# Patient Record
Sex: Male | Born: 1970 | Race: Black or African American | Hispanic: No | Marital: Married | Smoking: Never smoker
Health system: Southern US, Community
[De-identification: ages and names within clinical notes are randomized; demographics above are authoritative.]

## PROBLEM LIST (undated history)

## (undated) DIAGNOSIS — E782 Mixed hyperlipidemia: Secondary | ICD-10-CM

## (undated) DIAGNOSIS — R002 Palpitations: Secondary | ICD-10-CM

## (undated) DIAGNOSIS — E7800 Pure hypercholesterolemia, unspecified: Secondary | ICD-10-CM

## (undated) DIAGNOSIS — R7989 Other specified abnormal findings of blood chemistry: Secondary | ICD-10-CM

## (undated) DIAGNOSIS — N200 Calculus of kidney: Secondary | ICD-10-CM

## (undated) DIAGNOSIS — R945 Abnormal results of liver function studies: Secondary | ICD-10-CM

## (undated) DIAGNOSIS — F419 Anxiety disorder, unspecified: Secondary | ICD-10-CM

## (undated) DIAGNOSIS — G4700 Insomnia, unspecified: Secondary | ICD-10-CM

## (undated) DIAGNOSIS — F3170 Bipolar disorder, currently in remission, most recent episode unspecified: Secondary | ICD-10-CM

## (undated) DIAGNOSIS — I10 Essential (primary) hypertension: Secondary | ICD-10-CM

## (undated) DIAGNOSIS — R42 Dizziness and giddiness: Secondary | ICD-10-CM

## (undated) DIAGNOSIS — F605 Obsessive-compulsive personality disorder: Secondary | ICD-10-CM

## (undated) DIAGNOSIS — F410 Panic disorder [episodic paroxysmal anxiety] without agoraphobia: Secondary | ICD-10-CM

## (undated) HISTORY — DX: Insomnia, unspecified: G47.00

## (undated) HISTORY — DX: Mixed hyperlipidemia: E78.2

## (undated) HISTORY — DX: Other specified abnormal findings of blood chemistry: R79.89

## (undated) HISTORY — DX: Calculus of kidney: N20.0

## (undated) HISTORY — PX: THYROID SURGERY: SHX805

## (undated) HISTORY — DX: Essential (primary) hypertension: I10

## (undated) HISTORY — DX: Dizziness and giddiness: R42

## (undated) HISTORY — DX: Pure hypercholesterolemia, unspecified: E78.00

## (undated) HISTORY — DX: Obsessive-compulsive personality disorder: F60.5

## (undated) HISTORY — DX: Bipolar disorder, currently in remission, most recent episode unspecified: F31.70

## (undated) HISTORY — DX: Abnormal results of liver function studies: R94.5

## (undated) HISTORY — DX: Palpitations: R00.2

---

## 2001-11-07 ENCOUNTER — Ambulatory Visit (HOSPITAL_COMMUNITY): Admission: RE | Admit: 2001-11-07 | Discharge: 2001-11-07 | Payer: Self-pay | Admitting: Family Medicine

## 2001-11-07 ENCOUNTER — Encounter: Payer: Self-pay | Admitting: Family Medicine

## 2001-12-03 ENCOUNTER — Encounter: Payer: Self-pay | Admitting: Family Medicine

## 2001-12-03 ENCOUNTER — Ambulatory Visit (HOSPITAL_COMMUNITY): Admission: RE | Admit: 2001-12-03 | Discharge: 2001-12-03 | Payer: Self-pay | Admitting: Family Medicine

## 2002-02-03 ENCOUNTER — Ambulatory Visit (HOSPITAL_COMMUNITY): Admission: RE | Admit: 2002-02-03 | Discharge: 2002-02-03 | Payer: Self-pay

## 2002-02-03 ENCOUNTER — Encounter (INDEPENDENT_AMBULATORY_CARE_PROVIDER_SITE_OTHER): Payer: Self-pay | Admitting: *Deleted

## 2003-05-13 ENCOUNTER — Emergency Department (HOSPITAL_COMMUNITY): Admission: EM | Admit: 2003-05-13 | Discharge: 2003-05-13 | Payer: Self-pay | Admitting: Emergency Medicine

## 2003-11-10 ENCOUNTER — Inpatient Hospital Stay (HOSPITAL_COMMUNITY): Admission: EM | Admit: 2003-11-10 | Discharge: 2003-11-13 | Payer: Self-pay | Admitting: Psychiatry

## 2008-10-18 ENCOUNTER — Emergency Department (HOSPITAL_COMMUNITY): Admission: EM | Admit: 2008-10-18 | Discharge: 2008-10-18 | Payer: Self-pay | Admitting: Emergency Medicine

## 2009-03-30 ENCOUNTER — Encounter: Admission: RE | Admit: 2009-03-30 | Discharge: 2009-03-30 | Payer: Self-pay | Admitting: Family Medicine

## 2010-01-31 ENCOUNTER — Emergency Department (HOSPITAL_COMMUNITY): Admission: EM | Admit: 2010-01-31 | Discharge: 2010-01-31 | Payer: Self-pay | Admitting: Emergency Medicine

## 2011-01-06 ENCOUNTER — Encounter: Payer: Self-pay | Admitting: Family Medicine

## 2011-01-07 ENCOUNTER — Encounter: Payer: Self-pay | Admitting: Family Medicine

## 2011-03-07 LAB — URINALYSIS, ROUTINE W REFLEX MICROSCOPIC
Bilirubin Urine: NEGATIVE
Glucose, UA: NEGATIVE mg/dL
Ketones, ur: NEGATIVE mg/dL
Protein, ur: NEGATIVE mg/dL

## 2011-03-07 LAB — POCT I-STAT, CHEM 8
BUN: 8 mg/dL (ref 6–23)
Calcium, Ion: 1.22 mmol/L (ref 1.12–1.32)
Chloride: 103 mEq/L (ref 96–112)
Sodium: 137 mEq/L (ref 135–145)

## 2011-05-04 NOTE — Discharge Summary (Signed)
NAME:  Eric Conway, Eric Conway NO.:  [id number]   MEDICAL RECORD NO.:  [id number]                   PATIENT TYPE:  IPS   LOCATION:  0505                                 FACILITY:  [facility]   PHYSICIAN:  Geoffery Lyons, M.D.                   DATE OF BIRTH:  1971/06/18   DATE OF ADMISSION:  11/10/2003  DATE OF DISCHARGE:  11/13/2003                                 DISCHARGE SUMMARY   CHIEF COMPLAINT AND PRESENT ILLNESS:  This was the first admission to [facility] for this 40 year old African-American male,  married.  He was referred by his psychiatrist for suicidal ideation.  He had  been looking on the Internet for ways to kill himself.  He recently spent  $10,000 on a trip to [facility] and regretted it.  He endorsed mood swings,  decreased sleep, two or three hours, hypersexual, increased irritability,  spending, depression, reclusiveness, unable to get motivated.   PAST PSYCHIATRIC HISTORY:  This was the first time at [facility].  He was put on lithium, took it for three weeks, then stopped; the  only medication he had tried.   SUBSTANCE ABUSE HISTORY:  Occasional use of alcohol.  Denied any other  substances.   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   MEDICATIONS:  None.   PHYSICAL EXAMINATION:  Physical examination was performed, failed to show  any acute findings.   MENTAL STATUS EXAM:  Mental status exam revealed a fully alert male, bright  affect, cooperative, articulate, calm, focused.  Speech: Normal, articulate,  no pressure.  Mood: Depressed with what was going on.  Affect: Broad.  Thought processes: Logical and coherent; suicidal ruminations, no active  plan, no homicidal ideas, no auditory and visual hallucinations.  Cognitive:  Cognition was well preserved.   ADMISSION DIAGNOSES:   AXIS I:  Bipolar disorder.   AXIS II:  No diagnosis.   AXIS III:  No diagnosis.   AXIS IV:  Moderate.   AXIS V:  Global assessment of functioning upon admission 39, highest global  assessment of functioning in the last year 80.   LABORATORY DATA:  CBC was within normal limits.  Blood chemistries were  within normal limits.  SGOT 21, SGPT 41, alkaline phosphatase 37.   HOSPITAL COURSE:  He was admitted and started in intensive individual and  group psychotherapy.  He was given some Ambien for sleep and some Ativan as  needed for anxiety.  He was restarted on lithium Eskalith 450 mg that was  increased up to 450 mg twice a day.  He was placed on Wellbutrin XL 150 mg  in the morning.  He was able to get into the group and individual sessions.  He created increased insight in terms of what was going on with him,  increased understanding  of his mood disorder, and willingness to continue to  take medications and pursue outpatient treatment.  On November 27, he was in  full contact with reality, mood was better, saying that he now saw that he  needed to come when he came, increased understanding for the need to stay on  medications, willing to pursue outpatient treatment.  He was going to see  Dwan Bolt and Andee Poles, M.D.  Upon discharge, he was in full  contact with reality, no suicidal ideas, no homicidal ideas, no evidence of  hyperthymia, mood euthymic, no delusions, no hallucinations, willing to  pursue outpatient followup.  There was a family session with his mother that  went well and he was discharged.   DISCHARGE DIAGNOSES:   AXIS I:  Bipolar disorder.   AXIS II:  No diagnosis.   AXIS III:  No diagnosis.   AXIS IV:  Moderate.   AXIS V:  Global assessment of functioning upon discharge 60.   DISCHARGE MEDICATIONS:  1. Eskalith CR 450 mg twice a day.  Lithium level on beginning 0.33.  2. Wellbutrin XL 150 mg in the morning.  3. Ambien 10 mg at bedtime as needed for sleep.   FOLLOW UP:  He was to follow up with Andee Poles, M.D., and Dwan Bolt.                                               Geoffery Lyons, M.D.    IL/MEDQ  D:  12/02/2003  T:  12/03/2003  Job:  [id number]

## 2011-05-04 NOTE — Op Note (Signed)
[place]  Patient:    GRANITE, GODMAN Visit Number: [id number] MRN: [id number]          Service Type: DSU Location: [facility] 2899 19 Attending Physician:  Meredith Leeds Dictated by:   Zigmund Daniel, M.D. Proc. Date: 02/03/02 Admit Date:  02/03/2002   CC:         Arvella Merles, M.D.   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  PROCEDURE:  Excision of left inferior parathyroid adenoma.  SURGEON:  Zigmund Daniel, M.D.  ANESTHESIA:  General.  DESCRIPTION OF PROCEDURE:  After the patient was adequately monitored and anesthetized and had routine preparation and draping of the anterior neck, I used the Neoprobe to confirm that there was slight hyperactivity in the left lower neck.  I then made a 3 cm incision over the area of maximum radioactivity.  I incised the platysma muscle and incised the strap muscles medial to the sternocleidomastoid and exposed the lower pole of the thyroid gland.  I divided a couple of vessels very near the thyroid and resected on down to the tissue area around the inferior thyroid artery.  I identified a round, brick-red structure about 1 cm in size consistent with a parathyroid adenoma.  Staying very close to it, I excised it and controlled the blood supply and sent it for frozen section.  It was confirmed that it was parathyroid tissue and that it was consistent with an adenoma.  There was higher than normal radioactivity within the removed tissue.  After getting good hemostasis, I repaired the strap muscles and platysma with running, 3-0 Vicryl and closed the skin with running, intracuticular 4-0 Vicryl and Steri-Strips applying a bandage.  The patient tolerated the operation well. Dictated by:   Zigmund Daniel, M.D. Attending  Physician:  Meredith Leeds DD:  02/03/02 TD:  02/03/02 Job: [id number]

## 2011-09-18 LAB — POCT I-STAT, CHEM 8
BUN: 12
Calcium, Ion: 1.25
Creatinine, Ser: 1.8 — ABNORMAL HIGH
Glucose, Bld: 110 — ABNORMAL HIGH
TCO2: 25

## 2012-01-19 IMAGING — CR DG CHEST 2V
2 series · 2 of 2 positions shown · non-contrast
Comparison: 10/18/2008 study

CLINICAL DATA: History of coughing

CHEST - 2 VIEW

[view not recorded (1 of 2)]
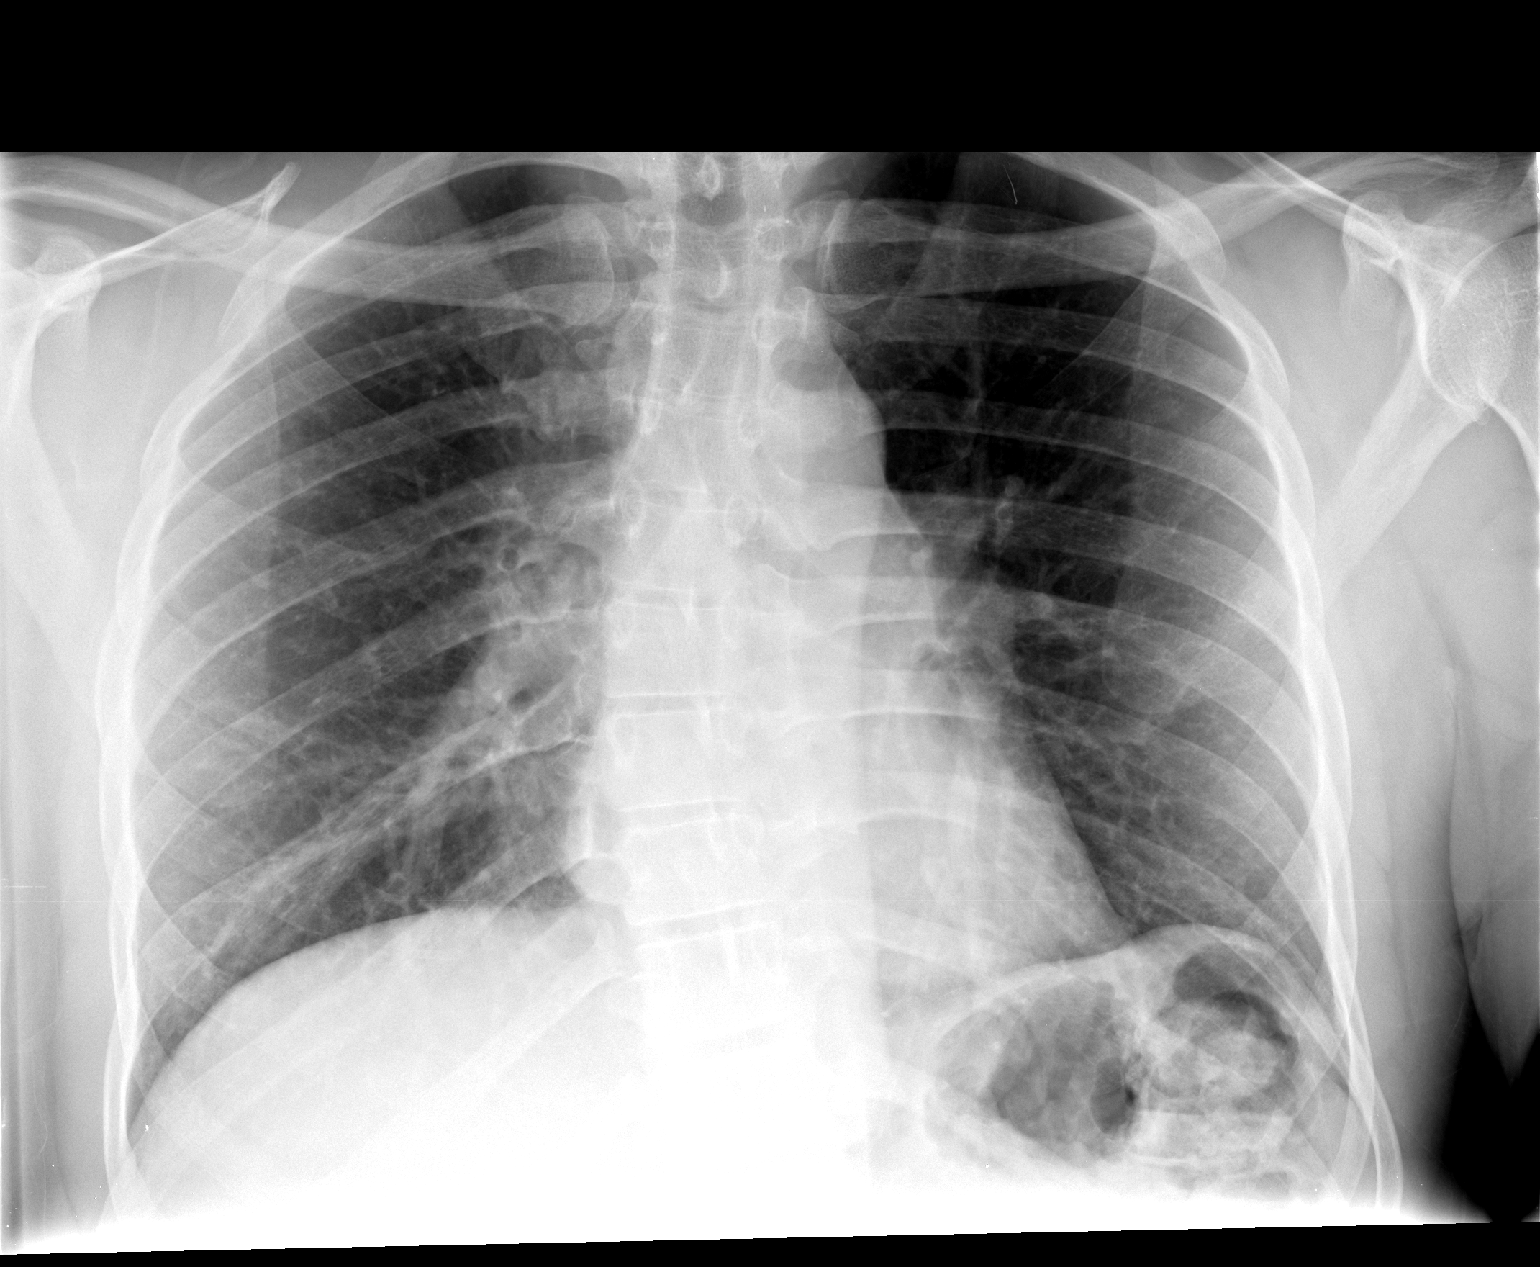

[view not recorded (2 of 2)]
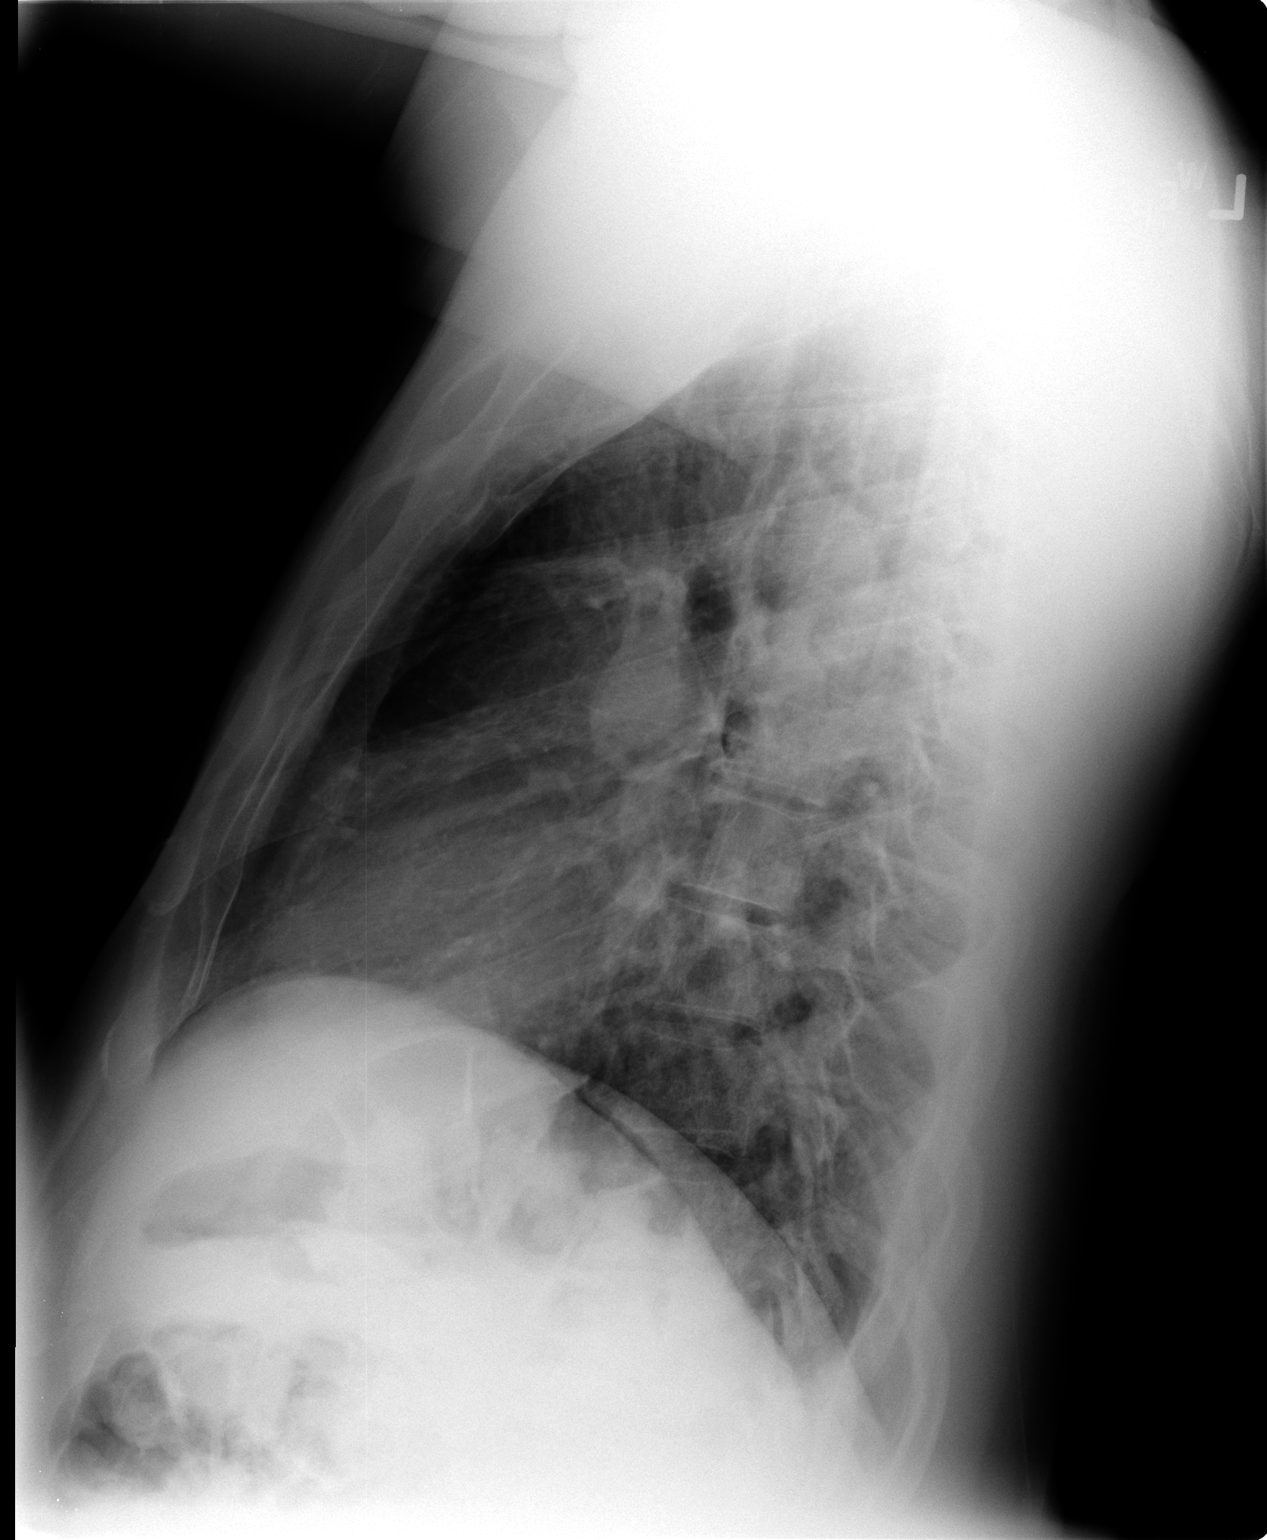

[2 of 2 positions shown; findings below may reference images not displayed]

FINDINGS: The cardiac silhouette is normal size and shape. There is
chronic slight elevation of the right hemidiaphragm and scoliosis
convexity to the right.  No pulmonary edema, pneumonia, or pleural
effusion is seen.
IMPRESSION: No cardiopulmonary disease is evident.  Scoliosis.

## 2012-06-15 ENCOUNTER — Other Ambulatory Visit: Payer: Self-pay

## 2012-06-15 ENCOUNTER — Encounter (HOSPITAL_COMMUNITY): Payer: Self-pay | Admitting: Physical Medicine and Rehabilitation

## 2012-06-15 ENCOUNTER — Emergency Department (HOSPITAL_COMMUNITY)
Admission: EM | Admit: 2012-06-15 | Discharge: 2012-06-15 | Disposition: A | Discharge status: Home / Self Care | Payer: BC Managed Care – PPO | Attending: Emergency Medicine | Admitting: Emergency Medicine

## 2012-06-15 DIAGNOSIS — F319 Bipolar disorder, unspecified: Secondary | ICD-10-CM | POA: Insufficient documentation

## 2012-06-15 DIAGNOSIS — F411 Generalized anxiety disorder: Secondary | ICD-10-CM | POA: Insufficient documentation

## 2012-06-15 DIAGNOSIS — Z79899 Other long term (current) drug therapy: Secondary | ICD-10-CM | POA: Insufficient documentation

## 2012-06-15 DIAGNOSIS — R110 Nausea: Secondary | ICD-10-CM | POA: Insufficient documentation

## 2012-06-15 DIAGNOSIS — F419 Anxiety disorder, unspecified: Secondary | ICD-10-CM

## 2012-06-15 DIAGNOSIS — R079 Chest pain, unspecified: Secondary | ICD-10-CM | POA: Insufficient documentation

## 2012-06-15 LAB — CBC
MCH: 29.3 pg (ref 26.0–34.0)
MCHC: 33.6 g/dL (ref 30.0–36.0)
MCV: 87.3 fL (ref 78.0–100.0)
Platelets: 262 10*3/uL (ref 150–400)
RDW: 14.3 % (ref 11.5–15.5)
WBC: 4.3 10*3/uL (ref 4.0–10.5)

## 2012-06-15 LAB — POCT I-STAT, CHEM 8
Calcium, Ion: 1.24 mmol/L (ref 1.12–1.32)
Creatinine, Ser: 1.3 mg/dL (ref 0.50–1.35)
Glucose, Bld: 105 mg/dL — ABNORMAL HIGH (ref 70–99)
HCT: 53 % — ABNORMAL HIGH (ref 39.0–52.0)
Hemoglobin: 18 g/dL — ABNORMAL HIGH (ref 13.0–17.0)
TCO2: 24 mmol/L (ref 0–100)

## 2012-06-15 MED ORDER — LORAZEPAM 1 MG PO TABS: 1.0000 mg | ORAL_TABLET | Freq: Three times a day (TID) | ORAL | Status: AC | PRN

## 2012-06-15 MED ORDER — LORAZEPAM 1 MG PO TABS
1.0000 mg | ORAL_TABLET | Freq: Once | ORAL | Status: AC
  Administered 2012-06-15: 1 mg via ORAL
  Filled 2012-06-15: qty 1

## 2012-06-15 NOTE — ED Notes (Addendum)
Pt presents to department for evaluation of midsternal chest pain, SOB, nausea and palpitations. Pt states he has been feeling very anxious since Wednesday. Also states he is short of breath and can feel his heart skipping beats. Denies chest pain at the time. Skin warm and dry. He is conscious alert and oriented x4.

## 2012-06-15 NOTE — ED Provider Notes (Signed)
History     CSN: [id number]  Arrival date & time 06/15/12  4098   First MD Initiated Contact with Patient 06/15/12 [phone]      Chief Complaint  Patient presents with  . Chest Pain    (Consider location/radiation/quality/duration/timing/severity/associated sxs/prior treatment) HPI Comments: Patient with a history of bipolar disease presents emergency department with chief complaint of morning time anxiety.  Patient states the onset of symptoms began about 4 days ago.  He's been waking up around 630 in the morning since Wednesday with nausea, diaphoresis, chest pain, and headaches that are associated with a butterfly type sensation in his stomach into feeling of being closed in.  Patient states the chest pain only occurred at Wednesday morning and has not returned since.  Additionally patient reports left-sided sinus pressure since Friday morning.  Symptoms suddenly resolved by noon.  Patient reports decreased sleep at night having about only 4-5 hours.  Patient's bipolar is followed by Dr. Tiburcio Pea at [place] and he is currently taking Lamictal, Wellbutrin, and lithium.  Patient has used Clonopin in the past as an as needed drug, however he reports he has not had any for the last 14 days.  Patient denies any fever, night sweats, chills, palpitations, lightheadedness, syncope, weakness, starchy, blurred vision, fasciculations ataxia, slurred speech, tinnitus or hand twitching.  The history is provided by the patient.    No past medical history on file.  No past surgical history on file.  History reviewed. No pertinent family history.  History  Substance Use Topics  . Smoking status: Never Smoker   . Smokeless tobacco: Not on file  . Alcohol Use: No      Review of Systems  Constitutional: Negative for fever, chills and appetite change.  HENT: Positive for sinus pressure. Negative for congestion, neck pain and tinnitus.   Eyes: Negative for visual disturbance.  Respiratory: Negative for  shortness of breath.   Cardiovascular: Positive for chest pain. Negative for leg swelling.  Gastrointestinal: Positive for nausea. Negative for abdominal pain.  Genitourinary: Negative for dysuria, urgency and frequency.  Neurological: Positive for headaches. Negative for dizziness, syncope, weakness, light-headedness and numbness.  Psychiatric/Behavioral: Positive for disturbed wake/sleep cycle. Negative for suicidal ideas, hallucinations, behavioral problems, confusion, self-injury, dysphoric mood, decreased concentration and agitation. The patient is nervous/anxious. The patient is not hyperactive.   All other systems reviewed and are negative.    Allergies  Review of patient's allergies indicates no known allergies.  Home Medications   Current Outpatient Rx  Name Route Sig Dispense Refill  . BUPROPION HCL ER (XL) 300 MG PO TB24 Oral Take 300 mg by mouth daily.    . OMEGA-3 FATTY ACIDS 1000 MG PO CAPS Oral Take 1 g by mouth daily.    Marland Kitchen FLUTICASONE PROPIONATE 50 MCG/ACT NA SUSP Nasal Place 2 sprays into the nose daily as needed. For congestion    . IBUPROFEN 200 MG PO TABS Oral Take 400 mg by mouth every 6 (six) hours as needed. For pain    . LAMOTRIGINE 200 MG PO TABS Oral Take 200 mg by mouth 2 (two) times daily.    Marland Kitchen CLA PO Oral Take 2 capsules by mouth daily.    Marland Kitchen LITHIUM CARBONATE ER 450 MG PO TBCR Oral Take 450 mg by mouth 2 (two) times daily.    . ADULT MULTIVITAMIN W/MINERALS CH Oral Take 1 tablet by mouth daily.      BP 135/88  Pulse 90  Temp 98.5 F (36.9 C) (Oral)  Resp 20  SpO2 98%  Physical Exam  Constitutional: He is oriented to person, place, and time. He appears well-developed and well-nourished. No distress.  HENT:  Head: Normocephalic and atraumatic.  Mouth/Throat: Oropharynx is clear and moist. No oropharyngeal exudate.  Eyes: Conjunctivae are normal. Pupils are equal, round, and reactive to light. No scleral icterus.  Neck: Normal range of motion. Neck  supple. No tracheal deviation present. No thyromegaly present.  Cardiovascular: Regular rhythm, normal heart sounds and intact distal pulses.        tachycardic  Pulmonary/Chest: Effort normal and breath sounds normal. No stridor. No respiratory distress. He has no wheezes.  Abdominal: Soft.       Soft, nontender  Musculoskeletal: Normal range of motion. He exhibits no edema and no tenderness.  Neurological: He is alert and oriented to person, place, and time. Coordination normal.       strength 5/5 bilaterally, goof coordination, no ataxia, CNIII-XII intact, neg pronator drift, no tremor  Skin: Skin is warm and dry. No rash noted. He is not diaphoretic. No erythema. No pallor.  Psychiatric: He has a normal mood and affect. His behavior is normal.    ED Course  Procedures (including critical care time)  Labs Reviewed  LITHIUM LEVEL - Abnormal; Notable for the following:    Lithium Lvl 0.40 (*)     All other components within normal limits  POCT I-STAT, CHEM 8 - Abnormal; Notable for the following:    Glucose, Bld 105 (*)     Hemoglobin 18.0 (*)     HCT 53.0 (*)     All other components within normal limits  CBC  POCT I-STAT TROPONIN I   No results found.   No diagnosis found.   Date: 06/15/2012  Rate: 92  Rhythm: normal sinus rhythm  QRS Axis: normal  Intervals: normal  ST/T Wave abnormalities: nonspecific ST changes and early repolarization  Conduction Disutrbances:none  Narrative Interpretation:   Old EKG Reviewed: unchanged    MDM  Anxiety, low lithium level   Patient chest pain not likely to be of cardiac etiology, patient with a lithium level and symptoms consistent with anxiety.  Patient symptoms resolved after Ativan dosage.  Patient will followup with his doctor to discuss medication adjustments.  Patient will be given a short prescription of Ativan for as needed anxiety relief.  Patient is agreeable with plan and appears to be reliable for followup.   At this  time there does not appear to be any evidence of an acute emergency medical condition and the patient appears stable for discharge with appropriate outpatient follow up.Diagnosis was discussed with patient who verbalizes understanding and is agreeable to discharge.   Jaci Carrel, [place] 06/15/12 1125

## 2012-06-15 NOTE — Discharge Instructions (Signed)

## 2012-06-16 NOTE — ED Provider Notes (Signed)
Medical screening examination/treatment/procedure(s) were performed by non-physician practitioner and as supervising physician I was immediately available for consultation/collaboration.   Gwyneth Sprout, MD 06/16/12 1640

## 2013-05-13 ENCOUNTER — Encounter (HOSPITAL_COMMUNITY): Payer: Self-pay | Admitting: *Deleted

## 2013-05-13 ENCOUNTER — Emergency Department (HOSPITAL_COMMUNITY)
Admission: EM | Admit: 2013-05-13 | Discharge: 2013-05-13 | Disposition: A | Discharge status: Home / Self Care | Payer: BC Managed Care – PPO | Source: Home / Self Care | Attending: Family Medicine | Admitting: Family Medicine

## 2013-05-13 DIAGNOSIS — A0811 Acute gastroenteropathy due to Norwalk agent: Secondary | ICD-10-CM

## 2013-05-13 MED ORDER — ONDANSETRON HCL 4 MG PO TABS: 4.0000 mg | ORAL_TABLET | Freq: Four times a day (QID) | ORAL | Status: DC

## 2013-05-13 MED ORDER — ONDANSETRON 4 MG PO TBDP
ORAL_TABLET | ORAL | Status: AC
  Filled 2013-05-13: qty 1

## 2013-05-13 MED ORDER — ONDANSETRON 4 MG PO TBDP
4.0000 mg | ORAL_TABLET | Freq: Once | ORAL | Status: AC
  Administered 2013-05-13: 4 mg via ORAL

## 2013-05-13 MED ORDER — LOPERAMIDE HCL 2 MG PO CAPS: 2.0000 mg | ORAL_CAPSULE | Freq: Four times a day (QID) | ORAL | Status: DC | PRN

## 2013-05-13 NOTE — ED Provider Notes (Signed)
History     CSN: [id number]  Arrival date & time 05/13/13  1509   First MD Initiated Contact with Patient 05/13/13 1614      Chief Complaint  Patient presents with  . Nausea    (Consider location/radiation/quality/duration/timing/severity/associated sxs/prior treatment) Patient is a 42 y.o. male presenting with diarrhea. The history is provided by the patient.  Diarrhea Quality:  Watery Severity:  Mild Onset quality:  Sudden Duration:  2 days Timing:  Intermittent Progression:  Unchanged Relieved by:  None tried Associated symptoms: chills   Associated symptoms: no abdominal pain and no vomiting   Risk factors: no sick contacts and no suspicious food intake     History reviewed. No pertinent past medical history.  History reviewed. No pertinent past surgical history.  No family history on file.  History  Substance Use Topics  . Smoking status: Never Smoker   . Smokeless tobacco: Not on file  . Alcohol Use: No      Review of Systems  Constitutional: Positive for chills.  Gastrointestinal: Positive for nausea and diarrhea. Negative for vomiting, abdominal pain and constipation.  Genitourinary: Negative.     Allergies  Review of patient's allergies indicates no known allergies.  Home Medications   Current Outpatient Rx  Name  Route  Sig  Dispense  Refill  . buPROPion (WELLBUTRIN XL) 300 MG 24 hr tablet   Oral   Take 300 mg by mouth daily.         . fish oil-omega-3 fatty acids 1000 MG capsule   Oral   Take 1 g by mouth daily.         . fluticasone (FLONASE) 50 MCG/ACT nasal spray   Nasal   Place 2 sprays into the nose daily as needed. For congestion         . ibuprofen (ADVIL,MOTRIN) 200 MG tablet   Oral   Take 400 mg by mouth every 6 (six) hours as needed. For pain         . lamoTRIgine (LAMICTAL) 200 MG tablet   Oral   Take 200 mg by mouth 2 (two) times daily.         . Linoleic Acid Conjugated (CLA PO)   Oral   Take 2 capsules  by mouth daily.         Marland Kitchen lithium carbonate (ESKALITH) 450 MG CR tablet   Oral   Take 450 mg by mouth 2 (two) times daily.         Marland Kitchen loperamide (IMODIUM) 2 MG capsule   Oral   Take 1 capsule (2 mg total) by mouth 4 (four) times daily as needed for diarrhea or loose stools.   12 capsule   0   . Multiple Vitamin (MULTIVITAMIN WITH MINERALS) TABS   Oral   Take 1 tablet by mouth daily.         . ondansetron (ZOFRAN) 4 MG tablet   Oral   Take 1 tablet (4 mg total) by mouth every 6 (six) hours. Prn n/v   8 tablet   0     BP 132/90  Pulse 87  Temp(Src) 97.6 F (36.4 C) (Oral)  Resp 18  SpO2 100%  Physical Exam  Nursing note and vitals reviewed. Constitutional: He is oriented to person, place, and time. He appears well-developed and well-nourished.  HENT:  Head: Normocephalic.  Mouth/Throat: Oropharynx is clear and moist.  Neck: Normal range of motion. Neck supple.  Pulmonary/Chest: Effort normal and breath sounds normal.  Abdominal: Soft. Bowel sounds are normal. He exhibits no distension and no mass. There is no tenderness. There is no rebound and no guarding.  Neurological: He is alert and oriented to person, place, and time.  Skin: Skin is warm and dry.    ED Course  Procedures (including critical care time)  Labs Reviewed - No data to display No results found.   1. Gastroenteritis due to norovirus       MDM          Linna Hoff, MD 05/13/13 1622

## 2013-05-13 NOTE — ED Notes (Signed)
Pt   Reports  Symptoms  Of  Nausea     Diarrhea   And  abd  Cramping    Symptoms  X  2  Days  No  Vomiting     Nobody  Else  At  [facility]

## 2015-05-10 ENCOUNTER — Ambulatory Visit: Payer: Self-pay | Admitting: Podiatry

## 2015-05-12 ENCOUNTER — Ambulatory Visit (INDEPENDENT_AMBULATORY_CARE_PROVIDER_SITE_OTHER): Payer: BLUE CROSS/BLUE SHIELD | Admitting: Podiatry

## 2015-05-12 ENCOUNTER — Encounter: Payer: Self-pay | Admitting: Podiatry

## 2015-05-12 VITALS — BP 124/71 | HR 77 | Temp 98.6°F | Resp 16

## 2015-05-12 DIAGNOSIS — S93529S Sprain of metatarsophalangeal joint of unspecified toe(s), sequela: Secondary | ICD-10-CM | POA: Diagnosis not present

## 2015-05-12 NOTE — Progress Notes (Signed)
° °  Subjective:    Patient ID: Eric Conway, male    DOB: [birth year], 44 y.o.   MRN: [id number]  HPI   N: Ache after he runs L; Both R>L big toe and 3rd toe D: around 9 months G: Sudden C: Not much pain A: Just running T: Soak it and aloe    Review of Systems  Allergic/Immunologic: Positive for environmental allergies.  All other systems reviewed and are negative.      Objective:   Physical Exam: I have reviewed his past history medications allergies surgery social history and review of systems. Pulses are strongly palpable lateral. Neurologic sensorium is intact per Semmes-Weinstein monofilament. Deep tendon reflexes are intact bilaterally muscle strength is 5 over 5 dorsiflexion plantar flexors and inverters everters all edges of musculature is intact. Orthopedic evaluation of his resultant joints distal to the ankle and full range of motion without crepitation. Cutaneous evaluation of a straight supple well-hydrated cutis cutaneous changes to the nail also demonstrates what appears to be termed hallux bilateral. hallux bilateral subungual hematomas with breakdown of the nail plate. This appears to be chronic in nature. I see no signs of infection.        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  Plan: Discussed appropriate shoe gear and we'll follow-up with him as needed.

## 2016-05-17 ENCOUNTER — Encounter (HOSPITAL_COMMUNITY): Payer: Self-pay | Admitting: Adult Health

## 2016-05-17 DIAGNOSIS — Z7951 Long term (current) use of inhaled steroids: Secondary | ICD-10-CM | POA: Insufficient documentation

## 2016-05-17 DIAGNOSIS — Z79899 Other long term (current) drug therapy: Secondary | ICD-10-CM | POA: Insufficient documentation

## 2016-05-17 DIAGNOSIS — F410 Panic disorder [episodic paroxysmal anxiety] without agoraphobia: Secondary | ICD-10-CM | POA: Insufficient documentation

## 2016-05-17 NOTE — ED Notes (Addendum)
Presents with hx of panic attacks, for the past week endorses feeling jittery, weakness in both legs and mind racing. No relief from taking Klonopin. This evening he reports he was doing fine and even fell asleep but woke at 10 pm and was unable to calm down. He took 2 klonopin with no relief. He walked the neighborhood the other evening at 2 am due to feeling anxiety and jittery and eventually became tired. Denies pain. Endorses eating a full meal this evening. Endorses on eepisode of nausea yesterday in the park. HE is concerned it is not a panic attack. "it comes and goes and is worse at night when I am alone and my mind just goes all over the place."

## 2016-05-18 ENCOUNTER — Emergency Department (HOSPITAL_COMMUNITY)
Admission: EM | Admit: 2016-05-18 | Discharge: 2016-05-18 | Disposition: A | Discharge status: Home / Self Care | Payer: BLUE CROSS/BLUE SHIELD | Attending: Emergency Medicine | Admitting: Emergency Medicine

## 2016-05-18 DIAGNOSIS — F410 Panic disorder [episodic paroxysmal anxiety] without agoraphobia: Secondary | ICD-10-CM

## 2016-05-18 HISTORY — DX: Anxiety disorder, unspecified: F41.9

## 2016-05-18 HISTORY — DX: Panic disorder (episodic paroxysmal anxiety): F41.0

## 2016-05-18 LAB — BASIC METABOLIC PANEL
Anion gap: 7 (ref 5–15)
BUN: 11 mg/dL (ref 6–20)
CHLORIDE: 108 mmol/L (ref 101–111)
CO2: 22 mmol/L (ref 22–32)
Calcium: 9.4 mg/dL (ref 8.9–10.3)
Creatinine, Ser: 1.26 mg/dL — ABNORMAL HIGH (ref 0.61–1.24)
GFR calc Af Amer: >60 mL/min (ref >60)
GFR calc non Af Amer: >60 mL/min (ref >60)
GLUCOSE: 104 mg/dL — AB (ref 65–99)
POTASSIUM: 3.5 mmol/L (ref 3.5–5.1)
Sodium: 137 mmol/L (ref 135–145)

## 2016-05-18 LAB — RAPID URINE DRUG SCREEN, HOSP PERFORMED
AMPHETAMINES: NOT DETECTED
BARBITURATES: NOT DETECTED
BENZODIAZEPINES: NOT DETECTED
Cocaine: NOT DETECTED
Opiates: NOT DETECTED
TETRAHYDROCANNABINOL: NOT DETECTED

## 2016-05-18 LAB — CBC WITH DIFFERENTIAL/PLATELET
Basophils Absolute: 0.1 10*3/uL (ref 0.0–0.1)
Basophils Relative: 2 %
EOS PCT: 3 %
Eosinophils Absolute: 0.1 10*3/uL (ref 0.0–0.7)
HCT: 44.8 % (ref 39.0–52.0)
HEMOGLOBIN: 14.3 g/dL (ref 13.0–17.0)
LYMPHS ABS: 2.1 10*3/uL (ref 0.7–4.0)
LYMPHS PCT: 54 %
MCH: 27.7 pg (ref 26.0–34.0)
MCHC: 31.9 g/dL (ref 30.0–36.0)
MCV: 86.8 fL (ref 78.0–100.0)
MONOS PCT: 9 %
Monocytes Absolute: 0.3 10*3/uL (ref 0.1–1.0)
NEUTROS PCT: 32 %
Neutro Abs: 1.3 10*3/uL — ABNORMAL LOW (ref 1.7–7.7)
Platelets: 233 10*3/uL (ref 150–400)
RBC: 5.16 MIL/uL (ref 4.22–5.81)
RDW: 14 % (ref 11.5–15.5)
WBC: 3.9 10*3/uL — AB (ref 4.0–10.5)

## 2016-05-18 LAB — ETHANOL: Alcohol, Ethyl (B): <5 mg/dL (ref <5)

## 2016-05-18 MED ORDER — ONDANSETRON HCL 4 MG PO TABS: 4.0000 mg | ORAL_TABLET | Freq: Four times a day (QID) | ORAL | Status: DC

## 2016-05-18 MED ORDER — LORAZEPAM 1 MG PO TABS
1.0000 mg | ORAL_TABLET | Freq: Once | ORAL | Status: AC
  Administered 2016-05-18: 1 mg via ORAL
  Filled 2016-05-18: qty 1

## 2016-05-18 MED ORDER — ONDANSETRON 4 MG PO TBDP
4.0000 mg | ORAL_TABLET | Freq: Once | ORAL | Status: AC
  Administered 2016-05-18: 4 mg via ORAL
  Filled 2016-05-18: qty 1

## 2016-05-18 MED ORDER — LORAZEPAM 1 MG PO TABS: 1.0000 mg | ORAL_TABLET | Freq: Three times a day (TID) | ORAL | Status: DC | PRN

## 2016-05-18 NOTE — Discharge Instructions (Signed)
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
## 2016-05-28 ENCOUNTER — Ambulatory Visit (HOSPITAL_COMMUNITY)
Admission: RE | Admit: 2016-05-28 | Discharge: 2016-05-28 | Disposition: A | Discharge status: Home / Self Care | Payer: BLUE CROSS/BLUE SHIELD | Attending: Psychiatry | Admitting: Psychiatry

## 2016-05-28 NOTE — BH Assessment (Signed)
Assessment Note  Eric Conway is an 45 y.o. male, African American, single who presents to [facility] as walk-in for increase and worsening panic attacks. Patient primary complaint is of more frequent panic attacks and bipolar disorder. Patient states that he has had daily frequent panic attacks within the past 2 weeks and that his medication was changed on 05-24-16 via regular psychiatrist.  Patient states that medication was changed due to trouble sleeping and panic attack elevated anxiety levels. Patient states that he was recently prescribed Fnapt and other medication unknown at time. Patient states regular medications[not new prescriptions] were what he took on 05-28-16 and that he was able to get a full nights rest for once in past week. Patient friend Eric Conway present during assessment and states that with new meds. Since has been in "zombified states' , and that panic attacks have become worse[daily]. Patient denies current SI/HI or past history of. Patient acknowledges hx. Of panic attacks with most recent visit to Er due to medical concerns and Er concurred that it was  Panic attack. Patient denies current ro past hx. Of AVH. Patient acknowledges x 1 pat inpatient psychiatric hospitalization in 2004 for Bipolar. Patient is seen outpatient at Dr. Sylvan Cheese office via Eric Neither for med mngmt. and brief therapy.  Patient denies current or past hx. Of substance abuse.   Patient is dressed in normal street attire, and is alert and oriented x4. Patient speech was within normal limits and motor behavior appeared normal. Patient thought process is coherent. Patient  does not appear to be responding to internal stimuli. Patient was cooperative throughout the assessment and states that he is agreeable to inpatient psychiatric treatment.   Diagnosis: 296.7 [F31.9] Bipolar Disorder, current or most recent unspecified  Past Medical History:  Past Medical History  Diagnosis Date  . Panic attack   .  Anxiety   . Palpitations   . Lightheadedness   . Elevated LFTs   . Combined hyperlipidemia   . Insomnia   . Pure hypercholesterolemia   . Bipolar disorder, currently in remission, most recent episode unspecified (HCC)   . Kidney stone     No past surgical history on file.  Family History: No family history on file.  Social History:  reports that he has never smoked. He does not have any smokeless tobacco history on file. He reports that he does not drink alcohol or use illicit drugs.  Additional Social History:  Alcohol / Drug Use Pain Medications: SEE MAR Prescriptions: SEE MAR (most recent change in prescriotions on 05/24/16 via psychiatrist) Over the Counter: SEE MAR History of alcohol / drug use?: No history of alcohol / drug abuse Longest period of sobriety (when/how long): NA  CIWA:   COWS:    Allergies: No Known Allergies  Home Medications:  (Not in a hospital admission)  OB/GYN Status:  No LMP for male patient.  General Assessment Data Location of Assessment: [facility] Assessment Services TTS Assessment: In system Is this a Tele or Face-to-Face Assessment?: Face-to-Face Is this an Initial Assessment or a Re-assessment for this encounter?: Initial Assessment Marital status: Single Maiden name: na Is patient pregnant?: No Pregnancy Status: No Living Arrangements: Alone Can pt return to current living arrangement?: Yes Admission Status: Voluntary Is patient capable of signing voluntary admission?: Yes Referral Source: Self/Family/Friend Insurance type: BCBS  Medical Screening Exam [facility] Walk-in ONLY) Medical Exam completed: No Reason for MSE not completed: Other: (pt hx. of anxeity/ pancik attacks/ did  not express need )  Crisis Care Plan Living Arrangements: Alone Name of Psychiatrist:  (Eric Conway sees Eric Conway at office) Name of Therapist:  (none, sees Eric Conway med mngmt brief counsel )  Education Status Is patient currently in school?:  No Current Grade: na Highest grade of school patient has completed: some colleg Name of school: unknown Contact person: Eric Conway [phone]  Risk to self with the past 6 months Suicidal Ideation: No Has patient been a risk to self within the past 6 months prior to admission? : No Suicidal Intent: No Has patient had any suicidal intent within the past 6 months prior to admission? : No Is patient at risk for suicide?: No Suicidal Plan?: No Has patient had any suicidal plan within the past 6 months prior to admission? : No Access to Means: No What has been your use of drugs/alcohol within the last 12 months?: na Previous Attempts/Gestures: No How many times?: 0 Other Self Harm Risks: none noted Triggers for Past Attempts: Unpredictable Intentional Self Injurious Behavior: None Family Suicide History: No Recent stressful life event(s): Other (Comment) (panick attacks frequent) Persecutory voices/beliefs?: No Depression: No Depression Symptoms: Insomnia, Fatigue, Feeling angry/irritable Substance abuse history and/or treatment for substance abuse?: No Suicide prevention information given to non-admitted patients: Not applicable  Risk to Others within the past 6 months Homicidal Ideation: No Does patient have any lifetime risk of violence toward others beyond the six months prior to admission? : No Thoughts of Harm to Others: No Current Homicidal Intent: No Current Homicidal Plan: No Access to Homicidal Means: No Identified Victim: na History of harm to others?: No Assessment of Violence: None Noted Violent Behavior Description: n/a Does patient have access to weapons?: No Criminal Charges Pending?: No Does patient have a court date: No Is patient on probation?: No  Psychosis Hallucinations: None noted Delusions: None noted  Mental Status Report Appearance/Hygiene: Unremarkable Eye Contact: Good Motor Activity: Unremarkable Speech: Unremarkable Level of  Consciousness: Alert Mood: Anxious, Despair Affect: Anxious Anxiety Level: Panic Attacks Panic attack frequency: daily Most recent panic attack: 05/28/16 Thought Processes: Coherent, Relevant Judgement: Partial Orientation: Person, Place, Time, Situation, Appropriate for developmental age Obsessive Compulsive Thoughts/Behaviors: Moderate  Cognitive Functioning Concentration: Normal Memory: Recent Intact, Remote Intact IQ: Average Insight: Fair Impulse Control: Poor Appetite: Fair Weight Loss: 0 Weight Gain: 0 Sleep: Decreased Total Hours of Sleep: 4 Vegetative Symptoms: None  ADLScreening [facility] Patient's cognitive ability adequate to safely complete daily activities?: Yes Patient able to express need for assistance with ADLs?: Yes Independently performs ADLs?: Yes (appropriate for developmental age)  Prior Inpatient Therapy Prior Inpatient Therapy: Yes Prior Therapy Dates: 2004 [facility] Reason for Treatment: Bipolar  Prior Outpatient Therapy Prior Outpatient Therapy: Yes Prior Therapy Dates: current Prior Therapy Facilty/Provider(s): Eric Conway Reason for Treatment: bipolar Does patient have an ACCT team?: No Does patient have Intensive In-House Services?  : No Does patient have [facility] services? : No Does patient have P4CC services?: No  ADL Screening (condition at time of admission) Patient's cognitive ability adequate to safely complete daily activities?: Yes Is the patient deaf or have difficulty hearing?: No Does the patient have difficulty concentrating, remembering, or making decisions?: No Patient able to express need for assistance with ADLs?: Yes Does the patient have difficulty dressing or bathing?: No Independently performs ADLs?: Yes (appropriate for developmental age) Does the patient have difficulty walking or climbing stairs?: No Weakness of Legs: None Weakness of Arms/Hands: None  Abuse/Neglect Assessment (Assessment to be complete while patient is alone) Physical Abuse: Denies Verbal Abuse: Denies Sexual Abuse: Denies Exploitation of patient/patient's resources: Denies Self-Neglect: Denies Values / Beliefs Cultural Requests During Hospitalization: None Spiritual Requests During Hospitalization: None   Advance Directives (For Healthcare) Does patient have an advance directive?: No Would patient like information on creating an advanced directive?: No - patient declined information    Additional Information 1:1 In Past 12 Months?: No CIRT Risk: No Elopement Risk: No Does patient have medical clearance?: Yes     Disposition: Per Vernona Rieger, NP does not meet criteria for inpatient psychiatric care.  Pt referred to intensive outpatient services, is scheduled for 05-31-16 but must contact intensive outpt. For time and final confirmation.  Disposition Initial Assessment Completed for this Encounter: Yes Disposition of Patient: Outpatient treatment Type of outpatient treatment: Psych Intensive Outpatient  On Site Evaluation by:   Reviewed with Physician:    Hipolito Bayley 05/28/2016 10:31 AM

## 2016-05-29 NOTE — Progress Notes (Signed)
Cardiology Office Note   Date:  05/30/2016   ID:  Eric Conway, DOB [birth year], MRN [id number]  PCP:  Johny Blamer, MD  Cardiologist:   Dietrich Pates, MD   Pt referred for evaluation of palpitations    History of Present Illness: Eric Conway is a 45 y.o. male with a history of palpiitations  Started 5/29  Felt so bad went to ER on 6/1  Blood work neg  ? an    After the ER psych wanter him to try fanapt  Reluctant to try right now   Stopped taking another med on 6/10  Made him feel like a zombie  Had been for 3 days  Cant remember name   Ran 7 miles this AM    No dizzines  Took 2 hours a little slow for him  Took it easy.    Outpatient Prescriptions Prior to Visit  Medication Sig Dispense Refill  . clonazePAM (KLONOPIN) 1 MG tablet Take 1 mg by mouth 2 (two) times daily as needed for anxiety.    . fluticasone (FLONASE) 50 MCG/ACT nasal spray Place 2 sprays into the nose daily as needed. For congestion    . lamoTRIgine (LAMICTAL) 200 MG tablet Take 200 mg by mouth daily. Reported on 05/18/2016    . lithium carbonate 300 MG capsule Take 300 mg by mouth 2 (two) times daily with a meal.    . QUEtiapine (SEROQUEL) 100 MG tablet Take 150 mg by mouth at bedtime.    . sildenafil (VIAGRA) 100 MG tablet Take 100 mg by mouth daily as needed for erectile dysfunction.    Marland Kitchen zolpidem (AMBIEN) 10 MG tablet Take 10 mg by mouth at bedtime as needed for sleep.    Marland Kitchen ibuprofen (ADVIL,MOTRIN) 200 MG tablet Take 400 mg by mouth every 6 (six) hours as needed. Reported on 05/30/2016    . LORazepam (ATIVAN) 1 MG tablet Take 1 tablet (1 mg total) by mouth 3 (three) times daily as needed for anxiety. (Patient not taking: Reported on 05/30/2016) 15 tablet 0  . ondansetron (ZOFRAN) 4 MG tablet Take 1 tablet (4 mg total) by mouth every 6 (six) hours. (Patient not taking: Reported on 05/30/2016) 12 tablet 0   No facility-administered medications prior to visit.     Allergies:   Review of patient's  allergies indicates no known allergies.   Past Medical History  Diagnosis Date  . Panic attack   . Anxiety   . Palpitations   . Lightheadedness   . Elevated LFTs   . Combined hyperlipidemia   . Insomnia   . Pure hypercholesterolemia   . Bipolar disorder, currently in remission, most recent episode unspecified (HCC)   . Kidney stone     Past Surgical History  Procedure Laterality Date  . Thyroid surgery       Social History:  The patient  reports that he has never smoked. He does not have any smokeless tobacco history on file. He reports that he does not drink alcohol or use illicit drugs.   Family History:  The patient's family history includes Hyperlipidemia in his father and mother; Hypertension in his father.    ROS:  Please see the history of present illness. All other systems are reviewed and  Negative to the above problem except as noted.    PHYSICAL EXAM: VS:  BP 116/80 mmHg  Pulse 97  Ht  (1.93 m)  Wt 227 lb 12.8 oz (103.329 kg)  BMI 27.74 kg/m2  GEN: Well nourished, well developed, in no acute distress HEENT: normal Neck: no JVD, carotid bruits, or masses Cardiac: RRR; no murmurs, rubs, or gallops,no edema  Respiratory:  clear to auscultation bilaterally, normal work of breathing GI: soft, nontender, nondistended, + BS  No hepatomegaly  MS: no deformity Moving all extremities   Skin: warm and dry, no rash Neuro:  Strength and sensation are intact Psych: euthymic mood, full affect   EKG:  EKG is ordered today.  SR 97    Lipid Panel No results found for: CHOL, TRIG, HDL, CHOLHDL, VLDL, LDLCALC, LDLDIRECT    Wt Readings from Last 3 Encounters:  05/30/16 227 lb 12.8 oz (103.329 kg)  05/17/16 227 lb (102.967 kg)      ASSESSMENT AND PLAN:  1  Palpitatiosn  Pt with palpitations since 5/29  No recent illness  Orhtostatic check he was asymptomatic but HR did increase   I recomm UA to check specific gravidty  I would also check TSH Encouraged him  to drink adequate fluids with electrolytes    Signed, Dietrich PatesPaula Radie Berges, MD  05/30/2016 11:49 AM    [facility] [address], [place], [place]  [address] Phone: [phone]; Fax: [phone]

## 2016-05-30 ENCOUNTER — Encounter: Payer: Self-pay | Admitting: Internal Medicine

## 2016-05-30 ENCOUNTER — Ambulatory Visit (INDEPENDENT_AMBULATORY_CARE_PROVIDER_SITE_OTHER): Payer: BLUE CROSS/BLUE SHIELD | Admitting: Internal Medicine

## 2016-05-30 VITALS — BP 116/80 | HR 97 | Ht 76.0 in | Wt 227.8 lb

## 2016-05-30 DIAGNOSIS — R002 Palpitations: Secondary | ICD-10-CM

## 2016-05-30 LAB — URINALYSIS
BILIRUBIN URINE: NEGATIVE
GLUCOSE, UA: NEGATIVE
Hgb urine dipstick: NEGATIVE
Ketones, ur: NEGATIVE
Leukocytes, UA: NEGATIVE
Nitrite: NEGATIVE
PH: 8 (ref 5.0–8.0)
Protein, ur: NEGATIVE
SPECIFIC GRAVITY, URINE: 1.018 (ref 1.001–1.035)

## 2016-05-30 NOTE — Patient Instructions (Signed)
Your physician recommends that you continue on your current medications as directed. Please refer to the Current Medication list given to you today. Your physician recommends that you return for lab work today (UA, TSH)  STAY HYDRATED!  DRINK PRIMARILY WATER AND NON-CAFFEINATED BEVERAGES

## 2016-05-31 ENCOUNTER — Institutional Professional Consult (permissible substitution): Payer: Self-pay | Admitting: Neurology

## 2016-05-31 LAB — TSH: TSH: 1.14 mIU/L (ref 0.40–4.50)

## 2016-06-05 ENCOUNTER — Other Ambulatory Visit: Payer: Self-pay | Admitting: *Deleted

## 2016-06-05 DIAGNOSIS — R002 Palpitations: Secondary | ICD-10-CM

## 2016-06-07 ENCOUNTER — Institutional Professional Consult (permissible substitution): Payer: BLUE CROSS/BLUE SHIELD | Admitting: Neurology

## 2016-06-07 ENCOUNTER — Telehealth: Payer: Self-pay

## 2016-06-07 NOTE — Telephone Encounter (Signed)
Patient did not show to appt today  

## 2016-06-08 ENCOUNTER — Encounter: Payer: Self-pay | Admitting: Neurology

## 2016-06-12 ENCOUNTER — Ambulatory Visit (INDEPENDENT_AMBULATORY_CARE_PROVIDER_SITE_OTHER): Payer: BLUE CROSS/BLUE SHIELD

## 2016-06-12 ENCOUNTER — Encounter: Payer: Self-pay | Admitting: Neurology

## 2016-06-12 ENCOUNTER — Ambulatory Visit (INDEPENDENT_AMBULATORY_CARE_PROVIDER_SITE_OTHER): Payer: BLUE CROSS/BLUE SHIELD | Admitting: Neurology

## 2016-06-12 VITALS — BP 113/75 | HR 84 | Resp 18 | Ht 76.0 in | Wt 233.0 lb

## 2016-06-12 DIAGNOSIS — R351 Nocturia: Secondary | ICD-10-CM

## 2016-06-12 DIAGNOSIS — G478 Other sleep disorders: Secondary | ICD-10-CM | POA: Diagnosis not present

## 2016-06-12 DIAGNOSIS — G4710 Hypersomnia, unspecified: Secondary | ICD-10-CM | POA: Diagnosis not present

## 2016-06-12 DIAGNOSIS — E663 Overweight: Secondary | ICD-10-CM

## 2016-06-12 DIAGNOSIS — R002 Palpitations: Secondary | ICD-10-CM | POA: Diagnosis not present

## 2016-06-12 NOTE — Patient Instructions (Signed)

## 2016-06-12 NOTE — Progress Notes (Signed)
Subjective:    Patient ID: Eric Conway is a 45 y.o. male.  HPI    Huston Foley, MD, PhD [facility] [address] [address] [place], [place] [address]  Dear Verlon Au,   I saw your patient, Eric Conway, upon your kind request in my neurologic clinic today for initial consultation of his sleep disorder, in particular, concern for underlying obstructive sleep apnea. The patient is unaccompanied today. As you know, Eric Conway is a 45 year old right-handed gentleman with an underlying medical history of bipolar disorder, obsessive compulsive personality disorder, panic attacks, anxiety, palpitations, elevated LFTs, hyperlipidemia, history of kidney stone, status post parathyroidectomy surgery, and overweight state, who reports sleep disruption, nighttime anxiety, and a sense of gasping at night. He is currently on long-acting lithium 300 mg twice daily, clonazepam 1 mg twice daily as needed, Lamictal 200 mg at night, Seroquel 300 mg strength 1/2 pill, 2 times at night. He does not smoke. He was recently seen by cardiology, Dr. Tenny Craw, on 05/30/2016 for palpitations; he is to do a 24 hour Holter monitor, which he is going to pick up today. I reviewed your office note from 05/08/2016, which you kindly included.  He had a prior sleep study several years ago which I reviewed. This was at [facility] sleep services on 02/10/2009. Sleep efficiency was 77.3%, REM latency 119 minutes, sleep latency was 55 minutes. Total AHI was less than 1 per hour, rising to 3 per hour during REM sleep. Average oxygen saturation was 94%, nadir was 91%. He had no significant PLMS. Mild snoring was noted.   He reports nonrestorative sleep, not sleeping through the night, having difficulty falling asleep and staying asleep. Bedtime is around 10 to 10:30 PM and usually takes him an hour to fall asleep. He will take generic Ambien an hour before bedtime. He has been taking Ambien nearly nightly. He denies  restless leg symptoms or leg twitching at night, he denies any morning headaches. He has to get up to use bathroom on average once per night. He lives alone. His girlfriend has noted the occasional pauses in his breathing. He has 2 grown sons, his 19 year old son has 3 young children, ages 40, 57, and 65-year-old and lives in [place]. The patient is planning to visit them soon. He works as a, B Photographer. He does not drink alcohol as he does not tolerate it. He does not use any illicit drugs. He is a nonsmoker. He does not drink caffeine. Wake up time officially should be around 7 AM but he typically is overweight before then, usually between 5:30 and 6 AM and cannot go back to sleep. He is not aware of any family history of OSA.   His Past Medical History Is Significant For: Past Medical History  Diagnosis Date  . Panic attack   . Anxiety   . Palpitations   . Lightheadedness   . Elevated LFTs   . Combined hyperlipidemia   . Insomnia   . Pure hypercholesterolemia   . Bipolar disorder, currently in remission, most recent episode unspecified (HCC)   . Kidney stone   . Obsessive compulsive personality disorder   . Hypertension     Her Past Surgical History Is Significant For: Past Surgical History  Procedure Laterality Date  . Thyroid surgery      Her Family History Is Significant For: Family History  Problem Relation Age of Onset  . Hyperlipidemia Mother   . Hyperlipidemia Father   . Hypertension Father  His Social History Is Significant For: Social History   Social History  . Marital Status: Single    Spouse Name: N/A  . Number of Children: 2  . Years of Education: 12   Occupational History  . Comedy Producer     Social History Main Topics  . Smoking status: Never Smoker   . Smokeless tobacco: None  . Alcohol Use: No  . Drug Use: No  . Sexual Activity: Not Asked   Other Topics Concern  . None   Social History Narrative   Denies caffeine use     His  Allergies Are:  No Known Allergies:   His Current Medications Are:  Outpatient Encounter Prescriptions as of 06/12/2016  Medication Sig  . clonazePAM (KLONOPIN) 1 MG tablet Take 1 mg by mouth 2 (two) times daily as needed for anxiety.  . fluticasone (FLONASE) 50 MCG/ACT nasal spray Place 2 sprays into the nose daily as needed. For congestion  . lamoTRIgine (LAMICTAL) 200 MG tablet Take 200 mg by mouth daily. Reported on 05/18/2016  . lithium carbonate 300 MG capsule Take 300 mg by mouth 2 (two) times daily with a meal.  . QUEtiapine (SEROQUEL) 300 MG tablet Take 300 mg by mouth at bedtime.   . sildenafil (VIAGRA) 100 MG tablet Take 100 mg by mouth daily as needed for erectile dysfunction.  Marland Kitchen. zolpidem (AMBIEN) 10 MG tablet Take 10 mg by mouth at bedtime as needed for sleep.   No facility-administered encounter medications on file as of 06/12/2016.  :  Review of Systems:  Out of a complete 14 point review of systems, all are reviewed and negative with the exception of these symptoms as listed below:   Review of Systems  Neurological:       Had sleep study 2010, no OSA or CPAP use.   Patient has trouble staying asleep, witnessed apnea, snoring, wakes up feeling tired, daytime tiredness, denies taking naps.   Epworth Sleepiness Scale 0= would never doze 1= slight chance of dozing 2= moderate chance of dozing 3= high chance of dozing  Sitting and reading:1 Watching TV:2 Sitting inactive in a public place (ex. Theater or meeting):0 As a passenger in a car for an hour without a break:0 Lying down to rest in the afternoon:0 Sitting and talking to someone:0 Sitting quietly after lunch (no alcohol):0 In a car, while stopped in traffic:0 Total:3   Objective:  Neurologic Exam  Physical Exam Physical Examination:   Filed Vitals:   06/12/16 0940  BP: 113/75  Pulse: 84  Resp: 18    General Examination: The patient is a very pleasant 45 y.o. male in no acute distress. He appears  well-developed and well-nourished and very well groomed.   HEENT: Normocephalic, atraumatic, pupils are equal, round and reactive to light and accommodation. Funduscopic exam is normal with sharp disc margins noted. Extraocular tracking is good without limitation to gaze excursion or nystagmus noted. Normal smooth pursuit is noted. Hearing is grossly intact. Tympanic membranes are clear bilaterally. Face is symmetric with normal facial animation and normal facial sensation. Speech is clear with no dysarthria noted. There is no hypophonia. There is no lip, neck/head, jaw or voice tremor. Neck is supple with full range of passive and active motion. There are no carotid bruits on auscultation. Oropharynx exam reveals: mild mouth dryness, good dental hygiene and mild airway crowding, due to longer tongue. Mallampati is class I. Tongue protrudes centrally and palate elevates symmetrically. Tonsils are 1+ in size. Neck size  is 17.5 inches. He has a Absent overbite. Nasal inspection reveals no significant nasal mucosal bogginess or redness and no septal deviation.   Chest: Clear to auscultation without wheezing, rhonchi or crackles noted.  Heart: S1+S2+0, regular and normal without murmurs, rubs or gallops noted.   Abdomen: Soft, non-tender and non-distended with normal bowel sounds appreciated on auscultation.  Extremities: There is no pitting edema in the distal lower extremities bilaterally. Pedal pulses are intact.  Skin: Warm and dry without trophic changes noted. There are no varicose veins.  Musculoskeletal: exam reveals no obvious joint deformities, tenderness or joint swelling or erythema.   Neurologically:  Mental status: The patient is awake, alert and oriented in all 4 spheres. His immediate and remote memory, attention, language skills and fund of knowledge are appropriate. There is no evidence of aphasia, agnosia, apraxia or anomia. Speech is clear with normal prosody and enunciation. Thought  process is linear. Mood is normal and affect is normal.  Cranial nerves II - XII are as described above under HEENT exam. In addition: shoulder shrug is normal with equal shoulder height noted. Motor exam: Normal bulk, strength and tone is noted. There is no drift, tremor or rebound. Romberg is negative. Reflexes are 2+ throughout. Babinski: Toes are flexor bilaterally. Fine motor skills and coordination: intact with normal finger taps, normal hand movements, normal rapid alternating patting, normal foot taps and normal foot agility.  Cerebellar testing: No dysmetria or intention tremor on finger to nose testing. Heel to shin is unremarkable bilaterally. There is no truncal or gait ataxia.  Sensory exam: intact to light touch, pinprick, vibration, temperature sense in the upper and lower extremities.  Gait, station and balance: He stands easily. No veering to one side is noted. No leaning to one side is noted. Posture is age-appropriate and stance is narrow based. Gait shows normal stride length and normal pace. No problems turning are noted. He turns en bloc. Tandem walk is unremarkable. Intact toe and heel stance is noted.               Assessment and Plan:  In summary, Eric Conway is a very pleasant 45 y.o.-year old male with an underlying medical history of bipolar disorder, obsessive compulsive personality disorder, panic attacks, anxiety, palpitations, elevated LFTs, hyperlipidemia, history of kidney stone, status post thyroid surgery, and overweight state, whose history and physical exam are concerning for obstructive sleep apnea (OSA). I had a long chat with the patient about my findings and the diagnosis of OSA, its prognosis and treatment options. We talked about medical treatments, surgical interventions and non-pharmacological approaches. I explained in particular the risks and ramifications of untreated moderate to severe OSA, especially with respect to developing cardiovascular disease  down the Road, including congestive heart failure, difficult to treat hypertension, cardiac arrhythmias, or stroke. Even type 2 diabetes has, in part, been linked to untreated OSA. Symptoms of untreated OSA include daytime sleepiness, memory problems, mood irritability and mood disorder such as depression and anxiety, lack of energy, as well as recurrent headaches, especially morning headaches. We talked about trying to maintain a healthy lifestyle in general, as well as the importance of weight control. I encouraged the patient to eat healthy, exercise daily and keep well hydrated, to keep a scheduled bedtime and wake time routine, to not skip any meals and eat healthy snacks in between meals. I advised the patient not to drive when feeling sleepy. I recommended the following at this time: sleep study with potential  positive airway pressure titration. (We will score hypopneas at 3% and split the sleep study into diagnostic and treatment portion, if the estimated. 2 hour AHI is >15/h).   I explained the sleep test procedure to the patient and also outlined possible surgical and non-surgical treatment options of OSA, including the use of a custom-made dental device (which would require a referral to a specialist dentist or oral surgeon), upper airway surgical options, such as pillar implants, radiofrequency surgery, tongue base surgery, and UPPP (which would involve a referral to an ENT surgeon). Rarely, jaw surgery such as mandibular advancement may be considered.  I also explained the CPAP treatment option to the patient, who indicated that he would be willing to try CPAP if the need arises. I explained the importance of being compliant with PAP treatment, not only for insurance purposes but primarily to improve His symptoms, and for the patient's long term health benefit, including to reduce His cardiovascular risks. I answered all his questions today and the patient was in agreement. I would like to see him  back after the sleep study is completed and encouraged him to call with any interim questions, concerns, problems or updates.   Thank you very much for allowing me to participate in the care of this nice patient. If I can be of any further assistance to you please do not hesitate to call me at [phone].  Sincerely,   Huston Foley, MD, PhD

## 2016-06-27 ENCOUNTER — Ambulatory Visit (INDEPENDENT_AMBULATORY_CARE_PROVIDER_SITE_OTHER): Payer: BLUE CROSS/BLUE SHIELD | Admitting: Neurology

## 2016-06-27 DIAGNOSIS — G4710 Hypersomnia, unspecified: Secondary | ICD-10-CM

## 2016-06-27 DIAGNOSIS — G4720 Circadian rhythm sleep disorder, unspecified type: Secondary | ICD-10-CM

## 2016-07-02 ENCOUNTER — Telehealth: Payer: Self-pay | Admitting: Internal Medicine

## 2016-07-02 NOTE — Telephone Encounter (Signed)
Mr. Eric Conway is returning a call to Dr. Tenny Crawoss   Thanks

## 2016-07-03 ENCOUNTER — Telehealth: Payer: Self-pay | Admitting: Neurology

## 2016-07-03 NOTE — Telephone Encounter (Signed)
Patient referred by Lauris PoagLeslie O'Neal, seen by me on 06/12/16, diagnostic PSG on 06/27/16.   Please call and notify the patient that the recent sleep study did not show any significant obstructive sleep apnea, snoring or PLMs. Please inform patient that we can go over the details of the study during a follow up appointment - we can also play it by ear if he prefers. Arrange a followup appointment if desired. Also, route or fax report to PCP and referring provider. Once you have spoken to patient, you can close this encounter.   Thanks,  Huston FoleySaima Amiaya Mcneeley, MD, PhD [facility]

## 2016-07-03 NOTE — Telephone Encounter (Signed)
I spoke to patient and he is aware of results and recommendations. He would like a copy of study. I will place at front desk with ROI form to fill out. I will also send a copy to PCP.

## 2016-07-04 NOTE — Telephone Encounter (Signed)
Trying to reach patient re: holter monitor results.

## 2016-07-12 ENCOUNTER — Telehealth: Payer: Self-pay | Admitting: Internal Medicine

## 2016-07-12 NOTE — Telephone Encounter (Signed)
Follow Up    Pt is returning a call from last week regarding test results. Please call.

## 2016-07-13 NOTE — Telephone Encounter (Signed)
Left detailed message on mobile voice mail (per DPR) of monitor results and asked patient to call back with any questions and to let Dr. Tenny Craw know how he has been feeling since she saw him.

## 2017-05-16 ENCOUNTER — Encounter: Payer: Self-pay | Admitting: Hematology

## 2017-05-16 ENCOUNTER — Telehealth: Payer: Self-pay | Admitting: Hematology

## 2017-05-16 NOTE — Telephone Encounter (Signed)
Appt has been scheduled for the pt to see Dr. Candise CheKale on 6/27 at 11am. Pt aware to arrive 30 minutes early. Demographics verified. Letter mailed to the pt and faxed to the referring.

## 2017-06-12 ENCOUNTER — Ambulatory Visit (HOSPITAL_BASED_OUTPATIENT_CLINIC_OR_DEPARTMENT_OTHER): Payer: BLUE CROSS/BLUE SHIELD | Admitting: Hematology

## 2017-06-12 ENCOUNTER — Encounter: Payer: Self-pay | Admitting: Hematology

## 2017-06-12 VITALS — BP 144/91 | HR 104 | Temp 98.2°F | Resp 20 | Ht 76.0 in | Wt 235.4 lb

## 2017-06-12 DIAGNOSIS — D702 Other drug-induced agranulocytosis: Secondary | ICD-10-CM

## 2017-06-12 DIAGNOSIS — I10 Essential (primary) hypertension: Secondary | ICD-10-CM

## 2017-06-12 DIAGNOSIS — F319 Bipolar disorder, unspecified: Secondary | ICD-10-CM

## 2017-06-12 NOTE — Patient Instructions (Signed)
Thank you for choosing [facility] to provide your oncology and hematology care.  To afford each patient quality time with our providers, please arrive 30 minutes before your scheduled appointment time.  If you arrive late for your appointment, you may be asked to reschedule.  We strive to give you quality time with our providers, and arriving late affects you and other patients whose appointments are after yours.  If you are a no show for multiple scheduled visits, you may be dismissed from the clinic at the providers discretion.   Again, thank you for choosing [facility], our hope is that these requests will decrease the amount of time that you wait before being seen by our physicians.  ______________________________________________________________________ Should you have questions after your visit to the [facility], please contact our office at [phone] between the hours of 8:30 and 4:30 p.m.    Voicemails left after 4:30p.m will not be returned until the following business day.   For prescription refill requests, please have your pharmacy contact us directly.  Please also try to allow 48 hours for prescription requests.   Please contact the scheduling department for questions regarding scheduling.  For scheduling of procedures such as PET scans, CT scans, MRI, Ultrasound, etc please contact central scheduling at [phone].   Resources For Cancer Patients and Caregivers:  American Cancer Society:  800-227-2345  Can help patients locate various types of support and financial assistance Cancer Care: 1-800-813-HOPE (4673) Provides financial assistance, online support groups, medication/co-pay assistance.   [place] DSS:  336-641-3447 Where to apply for food stamps, Medicaid, and utility assistance Medicare Rights Center: 800-333-4114 Helps people with Medicare understand their rights and benefits, navigate the Medicare system, and secure the  quality healthcare they deserve SCAT: 336-333-6589 [employer]'s shared-ride transportation service for eligible riders who have a disability that prevents them from riding the fixed route bus.   For additional information on assistance programs please contact our social worker:   Grier Hock/Abigail El[place]:  [phone] 

## 2017-06-12 NOTE — Progress Notes (Signed)
Marland Kitchen    HEMATOLOGY/ONCOLOGY CONSULTATION NOTE  Date of Service: 06/12/2017  Patient Care Team: Johny Blamer, MD as PCP - General (Family Medicine)  CHIEF COMPLAINTS/PURPOSE OF CONSULTATION:  Neutropenia  HISTORY OF PRESENTING ILLNESS:   Eric Conway is a  46 y.o. male who has been referred to [place] by Dr .Johny Blamer, MD for evaluation and management of leucopenia/neutropenia.  Patient has a h/o HTN, HLD, Bipolar disorder, OCD, anxiety on multiple psychotropics (Lithium, lamotrigine, seroquel - po BID per patient).  Patient was recently seen by his PCP and had labs on 04/01/2017 with CBC showing WBC count of 2.8K with ANC of 1k, hgb of 15.7 MCV 89 and platelets of 215k.  Review of available labs show the patients WBC has been in the mid 3k range from 2015 and recently in 02/2017 was 2.7k with ANC of 0.7k.  Patient notes no fevers/chills/nightsweats. No weight loss. No evidence of viral infection . He notes his seroquel dose was increased earlier this year.   MEDICAL HISTORY:  Past Medical History:  Diagnosis Date  . Anxiety   . Bipolar disorder, currently in remission, most recent episode unspecified (HCC)   . Combined hyperlipidemia   . Elevated LFTs   . Hypertension   . Insomnia   . Kidney stone   . Lightheadedness   . Obsessive compulsive personality disorder   . Palpitations   . Panic attack   . Pure hypercholesterolemia     SURGICAL HISTORY: Past Surgical History:  Procedure Laterality Date  . THYROID SURGERY      SOCIAL HISTORY: Social History   Social History  . Marital status: Single    Spouse name: N/A  . Number of children: 2  . Years of education: 12   Occupational History  . Comedy Producer     Social History Main Topics  . Smoking status: Never Smoker  . Smokeless tobacco: Not on file  . Alcohol use No  . Drug use: No  . Sexual activity: Not on file   Other Topics Concern  . Not on file   Social History Narrative   Denies  caffeine use     FAMILY HISTORY: Family History  Problem Relation Age of Onset  . Hyperlipidemia Mother   . Hyperlipidemia Father   . Hypertension Father     ALLERGIES:  has No Known Allergies.  MEDICATIONS:  Current Outpatient Prescriptions  Medication Sig Dispense Refill  . clonazePAM (KLONOPIN) 1 MG tablet Take 1 mg by mouth 2 (two) times daily as needed for anxiety.    . fluticasone (FLONASE) 50 MCG/ACT nasal spray Place 2 sprays into the nose daily as needed. For congestion    . lamoTRIgine (LAMICTAL) 200 MG tablet Take 200 mg by mouth daily. Reported on 05/18/2016    . lithium carbonate 300 MG capsule Take 300 mg by mouth 2 (two) times daily with a meal.    . QUEtiapine (SEROQUEL) 300 MG tablet Take 300 mg by mouth at bedtime.     . sildenafil (VIAGRA) 100 MG tablet Take 100 mg by mouth daily as needed for erectile dysfunction.    Marland Kitchen zolpidem (AMBIEN) 10 MG tablet Take 10 mg by mouth at bedtime as needed for sleep.     No current facility-administered medications for this visit.     REVIEW OF SYSTEMS:    10 Point review of Systems was done is negative except as noted above.  PHYSICAL EXAMINATION: ECOG PERFORMANCE STATUS: 0 - Asymptomatic  . Vitals:  06/12/17 1104  BP: (!) 144/91  Pulse: (!) 104  Resp: 20  Temp: 98.2 F (36.8 C)   Filed Weights   06/12/17 1104  Weight: 235 lb 6.4 oz (106.8 kg)   .Body mass index is 28.65 kg/m.  GENERAL:alert, in no acute distress and comfortable SKIN: no acute rashes, no significant lesions EYES: conjunctiva are pink and non-injected, sclera anicteric OROPHARYNX: MMM, no exudates, no oropharyngeal erythema or ulceration NECK: supple, no JVD LYMPH:  no palpable lymphadenopathy in the cervical, axillary or inguinal regions LUNGS: clear to auscultation b/l with normal respiratory effort HEART: regular rate & rhythm ABDOMEN:  normoactive bowel sounds , non tender, not distended. No palpable hepatosplenomegaly. Extremity: no  pedal edema PSYCH: alert & oriented x 3 with fluent speech NEURO: no focal motor/sensory deficits  LABORATORY DATA:  I have reviewed the data as listed  . CBC Latest Ref Rng & Units 05/17/2016 06/15/2012 06/15/2012  WBC 4.0 - 10.5 K/uL 3.9(L) - 4.3  Hemoglobin 13.0 - 17.0 g/dL 69.614.3 18.0(H) 16.4  Hematocrit 39.0 - 52.0 % 44.8 53.0(H) 48.8  Platelets 150 - 400 K/uL 233 - 262   ANC 1300 . CMP Latest Ref Rng & Units 05/17/2016 06/15/2012 01/31/2010  Glucose 65 - 99 mg/dL 295(M104(H) 841(L105(H) 244(W101(H)  BUN 6 - 20 mg/dL 11 11 8   Creatinine 0.61 - 1.24 mg/dL 1.02(V1.26(H) 2.531.30 1.5  Sodium 135 - 145 mmol/L 137 140 137  Potassium 3.5 - 5.1 mmol/L 3.5 4.1 4.3  Chloride 101 - 111 mmol/L 108 104 103  CO2 22 - 32 mmol/L 22 - -  Calcium 8.9 - 10.3 mg/dL 9.4 - -     RADIOGRAPHIC STUDIES: I have personally reviewed the radiological images as listed and agreed with the findings in the report. No results found.  ASSESSMENT & PLAN:   46 yo male with   1) Isolated Leukopenia/Neutropenia Wbc varying from 2.7k to 3.9k since 2015. ANC 0.7 to 1.3k. No issues with infections.  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 However the primary etiology of his leucopenia/neutropenia appears to be his psychotropic medications (primarily fairly sizable doses of Seroquel). Lithium typically causes leucocytosis and not leucopenia/neutropenia. Plan -I discussed the possible etiologies of the patients leucopenia. -we discussed that his leucopenia is likely medication induced and will need to be monitored by his psychiatrist and PCP and if worsening might need decrease in dose of seroquel. -reasonable to take daily B complex -patient did not want to pursue additional lab testing at this time. -if leucopenia/neutropenia worsens despite medication management or new cytopenias arise - additional workup and possible BM Bx might be needed. -if significant infection and patient neutropenic - might need G-CSF in that setting  but not indicated at this time.   RTC with Dr Candise CheKale on an as needed basis  All of the patients questions were answered with apparent satisfaction. The patient knows to call the clinic with any problems, questions or concerns.  I spent 45 minutes counseling the patient face to face. The total time spent in the appointment was 60 minutes and more than 50% was on counseling and direct patient cares.    Wyvonnia LoraGautam Jelina Paulsen MD MS [facility]AHIVMS [facility] Hematology/Oncology Physician [facility]  (Office):       [phone] (Work cell):  [phone] (Fax):           [phone]  06/12/2017 11:39 AM

## 2017-06-17 ENCOUNTER — Telehealth: Payer: Self-pay | Admitting: Hematology

## 2017-06-17 NOTE — Telephone Encounter (Signed)
Per 6/27 -RTC with Dr Candise CheKale on an as needed basis

## 2018-12-23 ENCOUNTER — Emergency Department (HOSPITAL_COMMUNITY)
Admission: EM | Admit: 2018-12-23 | Discharge: 2018-12-24 | Disposition: A | Discharge status: Home / Self Care | Payer: Self-pay | Attending: Emergency Medicine | Admitting: Emergency Medicine

## 2018-12-23 ENCOUNTER — Encounter (HOSPITAL_COMMUNITY): Payer: Self-pay | Admitting: Emergency Medicine

## 2018-12-23 DIAGNOSIS — R197 Diarrhea, unspecified: Secondary | ICD-10-CM | POA: Insufficient documentation

## 2018-12-23 DIAGNOSIS — I10 Essential (primary) hypertension: Secondary | ICD-10-CM | POA: Insufficient documentation

## 2018-12-23 DIAGNOSIS — R112 Nausea with vomiting, unspecified: Secondary | ICD-10-CM | POA: Insufficient documentation

## 2018-12-23 DIAGNOSIS — Z79899 Other long term (current) drug therapy: Secondary | ICD-10-CM | POA: Insufficient documentation

## 2018-12-23 DIAGNOSIS — F319 Bipolar disorder, unspecified: Secondary | ICD-10-CM | POA: Insufficient documentation

## 2018-12-23 DIAGNOSIS — T361X5A Adverse effect of cephalosporins and other beta-lactam antibiotics, initial encounter: Secondary | ICD-10-CM | POA: Insufficient documentation

## 2018-12-23 DIAGNOSIS — T887XXA Unspecified adverse effect of drug or medicament, initial encounter: Secondary | ICD-10-CM

## 2018-12-23 NOTE — ED Triage Notes (Signed)
Per pt, was diagnosed w/ pneumonia yesterday at urgent care, has been on antibiotics.  Now c/o emesis and diarrhea.  Chest is starting to feel better.

## 2018-12-24 MED ORDER — DOXYCYCLINE HYCLATE 100 MG PO CAPS: 100.0000 mg | ORAL_CAPSULE | Freq: Two times a day (BID) | ORAL | 0 refills | Status: DC

## 2018-12-24 MED ORDER — ONDANSETRON 4 MG PO TBDP: 4.0000 mg | ORAL_TABLET | Freq: Three times a day (TID) | ORAL | 0 refills | Status: AC | PRN

## 2018-12-24 MED ORDER — DOXYCYCLINE HYCLATE 100 MG PO TABS
100.0000 mg | ORAL_TABLET | Freq: Once | ORAL | Status: AC
  Administered 2018-12-24: 100 mg via ORAL
  Filled 2018-12-24: qty 1

## 2018-12-24 MED ORDER — ONDANSETRON 4 MG PO TBDP
4.0000 mg | ORAL_TABLET | Freq: Once | ORAL | Status: AC
  Administered 2018-12-24: 4 mg via ORAL
  Filled 2018-12-24: qty 1

## 2018-12-24 NOTE — ED Provider Notes (Signed)
[facility] EMERGENCY DEPARTMENT Provider Note   CSN: [id number] Arrival date & time: 12/23/18  1937     History   Chief Complaint Chief Complaint  Patient presents with  . Emesis  . Diarrhea    HPI Eric Conway is a 48 y.o. male.  Patient presents to the emergency department with a chief complaint of nausea and vomiting.  He also reports a small amount of diarrhea.  He states that he was recently diagnosed with pneumonia and was prescribed Levaquin yesterday.  He states that he had the vomiting after taking the Levaquin today.  He was concerned because of the new symptoms.  He denies any fevers chills.  He states that his cough has been improving.  The history is provided by the patient. No language interpreter was used.    Past Medical History:  Diagnosis Date  . Anxiety   . Bipolar disorder, currently in remission, most recent episode unspecified (HCC)   . Combined hyperlipidemia   . Elevated LFTs   . Hypertension   . Insomnia   . Kidney stone   . Lightheadedness   . Obsessive compulsive personality disorder (HCC)   . Palpitations   . Panic attack   . Pure hypercholesterolemia     There are no active problems to display for this patient.   Past Surgical History:  Procedure Laterality Date  . THYROID SURGERY          Home Medications    Prior to Admission medications   Medication Sig Start Date End Date Taking? Authorizing Provider  clonazePAM (KLONOPIN) 1 MG tablet Take 1 mg by mouth 2 (two) times daily as needed for anxiety.    [provider]  fluticasone (FLONASE) 50 MCG/ACT nasal spray Place 2 sprays into the nose daily as needed. For congestion    [provider]  lamoTRIgine (LAMICTAL) 200 MG tablet Take 200 mg by mouth daily. Reported on 05/18/2016    [provider]  lithium carbonate 300 MG capsule Take 300 mg by mouth 2 (two) times daily with a meal.    [provider]  QUEtiapine (SEROQUEL)  300 MG tablet Take 300 mg by mouth at bedtime.     [provider]  sildenafil (VIAGRA) 100 MG tablet Take 100 mg by mouth daily as needed for erectile dysfunction.    [provider]  zolpidem (AMBIEN) 10 MG tablet Take 10 mg by mouth at bedtime as needed for sleep.    [provider]    Family History Family History  Problem Relation Age of Onset  . Hyperlipidemia Mother   . Hyperlipidemia Father   . Hypertension Father     Social History Social History   Tobacco Use  . Smoking status: Never Smoker  . Smokeless tobacco: Never Used  Substance Use Topics  . Alcohol use: No    Alcohol/week: 0.0 standard drinks  . Drug use: No     Allergies   Patient has no known allergies.   Review of Systems Review of Systems  All other systems reviewed and are negative.    Physical Exam Updated Vital Signs BP 117/85 (BP Location: Right Arm)   Pulse 82   Temp 97.7 F (36.5 C) (Oral)   Resp 16   Ht 6\' 4"  (1.93 m)   Wt 97.5 kg   SpO2 99%   BMI 26.17 kg/m   Physical Exam Vitals signs and nursing note reviewed.  Constitutional:  Appearance: He is well-developed.  HENT:     Head: Normocephalic and atraumatic.  Eyes:     General: No scleral icterus.       Right eye: No discharge.        Left eye: No discharge.     Conjunctiva/sclera: Conjunctivae normal.     Pupils: Pupils are equal, round, and reactive to light.  Neck:     Musculoskeletal: Normal range of motion and neck supple.     Vascular: No JVD.  Cardiovascular:     Rate and Rhythm: Normal rate and regular rhythm.     Heart sounds: Normal heart sounds. No murmur. No friction rub. No gallop.   Pulmonary:     Effort: Pulmonary effort is normal. No respiratory distress.     Breath sounds: Rales present. No wheezing.  Chest:     Chest wall: No tenderness.  Abdominal:     General: There is no distension.     Palpations: Abdomen is soft. There is no mass.     Tenderness: There is no  abdominal tenderness. There is no guarding or rebound.  Musculoskeletal: Normal range of motion.        General: No tenderness.  Skin:    General: Skin is warm and dry.  Neurological:     Mental Status: He is alert and oriented to person, place, and time.  Psychiatric:        Behavior: Behavior normal.        Thought Content: Thought content normal.        Judgment: Judgment normal.      ED Treatments / Results  Labs (all labs ordered are listed, but only abnormal results are displayed) Labs Reviewed - No data to display  EKG None  Radiology No results found.  Procedures Procedures (including critical care time)  Medications Ordered in ED Medications  ondansetron (ZOFRAN-ODT) disintegrating tablet 4 mg (has no administration in time range)  doxycycline (VIBRA-TABS) tablet 100 mg (has no administration in time range)     Initial Impression / Assessment and Plan / ED Course  I have reviewed the triage vital signs and the nursing notes.  Pertinent labs & imaging results that were available during my care of the patient were reviewed by me and considered in my medical decision making (see chart for details).     Patient with nausea and vomiting following taking Levaquin.  He also reports having some mild diarrhea.  Likely side effect of the antibiotic.  He is nontoxic-appearing, and in no acute distress.  As he is only had one dose of the Levaquin, I will switch him to doxycycline and prescribe Zofran.  Will give first dose in the ED to ensure that he is able to tolerate it.  Anticipate discharged home.  Final Clinical Impressions(s) / ED Diagnoses   Final diagnoses:  Medication side effect  Nausea vomiting and diarrhea    ED Discharge Orders         Ordered    doxycycline (VIBRAMYCIN) 100 MG capsule  2 times daily     12/24/18 0057    ondansetron (ZOFRAN ODT) 4 MG disintegrating tablet  Every 8 hours PRN     12/24/18 0057           Roxy Horseman,  PA-C 12/24/18 0059    Ward, Layla Maw, DO 12/24/18 [phone]

## 2020-03-28 NOTE — Progress Notes (Signed)
Cardiology Office Note:    Date:  03/29/2020   ID:  Eric Conway, DOB 1971/09/15, MRN [id number]  PCP:  Eric Blamer, MD  Cardiologist:  Eric Ishikawa, MD  Electrophysiologist:  None   Referring MD: Eric Blamer, MD   Chief Complaint  Patient presents with  . New Patient (Initial Visit)  . Palpitations    History of Present Illness:    Eric Conway is a 49 y.o. male with a hx of bipolar disorder, hypertension, hyperlipidemia who is referred by Dr. Tiburcio Pea for evaluation of palpitations.  Reports palpitations have occurred for a few months.  During episodes notices his heart is racing.  Occurs at least a few times per day.  He denies any lightheadedness, syncope, chest pain, or dyspnea.  Reports that he exercises by running or brisk walk for 1.5 hours, 4 days/week.  States that he will do 8 to 11 miles per day.  He smokes marijuana once per week, no cigarette use.  Denies any caffeine or alcohol use intake.     Past Medical History:  Diagnosis Date  . Anxiety   . Bipolar disorder, currently in remission, most recent episode unspecified (HCC)   . Combined hyperlipidemia   . Elevated LFTs   . Hypertension   . Insomnia   . Kidney stone   . Lightheadedness   . Obsessive compulsive personality disorder (HCC)   . Palpitations   . Panic attack   . Pure hypercholesterolemia     Past Surgical History:  Procedure Laterality Date  . THYROID SURGERY      Current Medications: Current Meds  Medication Sig  . clonazePAM (KLONOPIN) 1 MG tablet Take 1 mg by mouth 2 (two) times daily as needed for anxiety.  . fluticasone (FLONASE) 50 MCG/ACT nasal spray Place 2 sprays into the nose daily as needed. For congestion  . lamoTRIgine (LAMICTAL) 200 MG tablet Take 200 mg by mouth daily. Reported on 05/18/2016  . lithium carbonate 300 MG capsule Take 300 mg by mouth 2 (two) times daily with a meal.  . ondansetron (ZOFRAN ODT) 4 MG disintegrating tablet Take 1 tablet (4 mg  total) by mouth every 8 (eight) hours as needed for nausea or vomiting.  Marland Kitchen QUEtiapine (SEROQUEL) 300 MG tablet Take 300 mg by mouth at bedtime.   Marland Kitchen zolpidem (AMBIEN) 10 MG tablet Take 10 mg by mouth at bedtime as needed for sleep.  . [DISCONTINUED] doxycycline (VIBRAMYCIN) 100 MG capsule Take 1 capsule (100 mg total) by mouth 2 (two) times daily.  . [DISCONTINUED] sildenafil (VIAGRA) 100 MG tablet Take 100 mg by mouth daily as needed for erectile dysfunction.     Allergies:   Patient has no known allergies.   Social History   Socioeconomic History  . Marital status: Married    Spouse name: Not on file  . Number of children: 2  . Years of education: 5  . Highest education level: Not on file  Occupational History  . Occupation: Training and development officer   Tobacco Use  . Smoking status: Never Smoker  . Smokeless tobacco: Never Used  Substance and Sexual Activity  . Alcohol use: No    Alcohol/week: 0.0 standard drinks  . Drug use: No  . Sexual activity: Not on file  Other Topics Concern  . Not on file  Social History Narrative   Denies caffeine use    Social Determinants of Health   Financial Resource Strain:   . Difficulty of Paying Living Expenses:  Food Insecurity:   . Worried About Charity fundraiser in the Last Year:   . Arboriculturist in the Last Year:   Transportation Needs:   . Film/video editor (Medical):   Marland Kitchen Lack of Transportation (Non-Medical):   Physical Activity:   . Days of Exercise per Week:   . Minutes of Exercise per Session:   Stress:   . Feeling of Stress :   Social Connections:   . Frequency of Communication with Friends and Family:   . Frequency of Social Gatherings with Friends and Family:   . Attends Religious Services:   . Active Member of Clubs or Organizations:   . Attends Archivist Meetings:   Marland Kitchen Marital Status:      Family History: The patient's family history includes Hyperlipidemia in his father and mother; Hypertension in  his father.  ROS:   Please see the history of present illness.     All other systems reviewed and are negative.  EKGs/Labs/Other Studies Reviewed:    The following studies were reviewed today:   EKG:  EKG is  ordered today.  The ekg ordered today demonstrates normal sinus rhythm, rate 94, no ST/T wave  Recent Labs: No results found for requested labs within last 8760 hours.  Recent Lipid Panel No results found for: CHOL, TRIG, HDL, CHOLHDL, VLDL, LDLCALC, LDLDIRECT  Physical Exam:    VS:  BP 128/84 (BP Location: Right Arm, Patient Position: Sitting, Cuff Size: Normal)   Pulse 94   Temp (!) 97.1 F (36.2 C)   Ht 6\' 4"  (1.93 m)   Wt 235 lb (106.6 kg)   BMI 28.61 kg/m     Wt Readings from Last 3 Encounters:  03/29/20 235 lb (106.6 kg)  12/23/18 215 lb (97.5 kg)  06/12/17 235 lb 6.4 oz (106.8 kg)     GEN:  Well nourished, well developed in no acute distress HEENT: Normal NECK: No JVD; No carotid bruits LYMPHATICS: No lymphadenopathy CARDIAC: RRR, no murmurs, rubs, gallops RESPIRATORY:  Clear to auscultation without rales, wheezing or rhonchi  ABDOMEN: Soft, non-tender, non-distended MUSCULOSKELETAL:  No edema; No deformity  SKIN: Warm and dry NEUROLOGIC:  Alert and oriented x 3 PSYCHIATRIC:  Normal affect   ASSESSMENT:    1. Palpitations    PLAN:    In order of problems listed above:  Palpitations: Description concerning for arrhythmia.  Will check Zio patch x3 days  RTC in 3 months  Medication Adjustments/Labs and Tests Ordered: Current medicines are reviewed at length with the patient today.  Concerns regarding medicines are outlined above.  Orders Placed This Encounter  Procedures  . LONG TERM MONITOR (3-14 DAYS)  . EKG 12-Lead   No orders of the defined types were placed in this encounter.   Patient Instructions  Medication Instructions:  Your physician recommends that you continue on your current medications as directed. Please refer to the  Current Medication list given to you today.  Testing/Procedures:  Bryn Gulling- Long Term Monitor Instructions   Your physician has requested you wear your ZIO patch monitor 3 days.   This is a single patch monitor.  [employer] supplies one patch monitor per enrollment.  Additional stickers are not available.   Please do not apply patch if you will be having a Nuclear Stress Test, Echocardiogram, Cardiac CT, MRI, or Chest Xray during the time frame you would be wearing the monitor. The patch cannot be worn during these tests.  You cannot remove  and re-apply the ZIO XT patch monitor.   Your ZIO patch monitor will be sent USPS Priority mail from [facility] directly to your home address. The monitor may also be mailed to a PO BOX if home delivery is not available.   It may take 3-5 days to receive your monitor after you have been enrolled.   Once you have received you monitor, please review enclosed instructions.  Your monitor has already been registered assigning a specific monitor serial # to you.   Applying the monitor   Shave hair from upper left chest.   Hold abrader disc by orange tab.  Rub abrader in 40 strokes over left upper chest as indicated in your monitor instructions.   Clean area with 4 enclosed alcohol pads .  Use all pads to assure are is cleaned thoroughly.  Let dry.   Apply patch as indicated in monitor instructions.  Patch will be place under collarbone on left side of chest with arrow pointing upward.   Rub patch adhesive wings for 2 minutes.Remove white label marked "1".  Remove white label marked "2".  Rub patch adhesive wings for 2 additional minutes.   While looking in a mirror, press and release button in center of patch.  A small green light will flash 3-4 times .  This will be your only indicator the monitor has been turned on.     Do not shower for the first 24 hours.  You may shower after the first 24 hours.   Press button if you feel a symptom. You will  hear a small click.  Record Date, Time and Symptom in the Patient Log Book.   When you are ready to remove patch, follow instructions on last 2 pages of Patient Log Book.  Stick patch monitor onto last page of Patient Log Book.   Place Patient Log Book in [place] box.  Use locking tab on box and tape box closed securely.  The [employer] and [employer] has [employer] on it.  Please place in mailbox as soon as possible.  Your physician should have your test results approximately 7 days after the monitor has been mailed back to [facility].   Call [facility] Customer Care at [phone] if you have questions regarding your ZIO XT patch monitor.  Call them immediately if you see an orange light blinking on your monitor.   If your monitor falls off in less than 4 days contact our Monitor department at [phone].  If your monitor becomes loose or falls off after 4 days call [employer] at [phone] for suggestions on securing your monitor.   Follow-Up: At [facility], you and your health needs are our priority.  As part of our continuing mission to provide you with exceptional heart care, we have created designated Provider Care Teams.  These Care Teams include your primary Cardiologist (physician) and Advanced Practice Providers (APPs -  Physician Assistants and Nurse Practitioners) who all work together to provide you with the care you need, when you need it.  We recommend signing up for the patient portal called "MyChart".  Sign up information is provided on this After Visit Summary.  MyChart is used to connect with patients for Virtual Visits (Telemedicine).  Patients are able to view lab/test results, encounter notes, upcoming appointments, etc.  Non-urgent messages can be sent to your provider as well.   To learn more about what you can do with MyChart, go to [email or web address].    Your next appointment:  2 month(s)  The format for your next appointment:   Either In Person or  Virtual  Provider:   Epifanio Lesches, MD        Signed, Eric Ishikawa, MD  03/29/2020 9:06 PM     [facility]

## 2020-03-29 ENCOUNTER — Ambulatory Visit (INDEPENDENT_AMBULATORY_CARE_PROVIDER_SITE_OTHER): Payer: Self-pay | Admitting: Cardiology

## 2020-03-29 ENCOUNTER — Other Ambulatory Visit: Payer: Self-pay

## 2020-03-29 ENCOUNTER — Encounter: Payer: Self-pay | Admitting: Cardiology

## 2020-03-29 VITALS — BP 128/84 | HR 94 | Temp 97.1°F | Ht 76.0 in | Wt 235.0 lb

## 2020-03-29 DIAGNOSIS — R002 Palpitations: Secondary | ICD-10-CM

## 2020-03-29 NOTE — Patient Instructions (Signed)
Medication Instructions:  Your physician recommends that you continue on your current medications as directed. Please refer to the Current Medication list given to you today.  Testing/Procedures:  ZIO XT- Long Term Monitor Instructions   Your physician has requested you wear your ZIO patch monitor 3 days.   This is a single patch monitor.  [employer] supplies one patch monitor per enrollment.  Additional stickers are not available.   Please do not apply patch if you will be having a Nuclear Stress Test, Echocardiogram, Cardiac CT, MRI, or Chest Xray during the time frame you would be wearing the monitor. The patch cannot be worn during these tests.  You cannot remove and re-apply the ZIO XT patch monitor.   Your ZIO patch monitor will be sent USPS Priority mail from [employer] directly to your home address. The monitor may also be mailed to a PO BOX if home delivery is not available.   It may take 3-5 days to receive your monitor after you have been enrolled.   Once you have received you monitor, please review enclosed instructions.  Your monitor has already been registered assigning a specific monitor serial # to you.   Applying the monitor   Shave hair from upper left chest.   Hold abrader disc by orange tab.  Rub abrader in 40 strokes over left upper chest as indicated in your monitor instructions.   Clean area with 4 enclosed alcohol pads .  Use all pads to assure are is cleaned thoroughly.  Let dry.   Apply patch as indicated in monitor instructions.  Patch will be place under collarbone on left side of chest with arrow pointing upward.   Rub patch adhesive wings for 2 minutes.Remove white label marked "1".  Remove white label marked "2".  Rub patch adhesive wings for 2 additional minutes.   While looking in a mirror, press and release button in center of patch.  A small green light will flash 3-4 times .  This will be your only indicator the monitor has been turned on.      Do not shower for the first 24 hours.  You may shower after the first 24 hours.   Press button if you feel a symptom. You will hear a small click.  Record Date, Time and Symptom in the Patient Log Book.   When you are ready to remove patch, follow instructions on last 2 pages of Patient Log Book.  Stick patch monitor onto last page of Patient Log Book.   Place Patient Log Book in Blue box.  Use locking tab on box and tape box closed securely.  The Orange [employer] box has prepaid postage on it.  Please place in mailbox as soon as possible.  Your physician should have your test results approximately 7 days after the monitor has been mailed back to [employer].   Call [employer] Customer Care at [phone] if you have questions regarding your ZIO XT patch monitor.  Call them immediately if you see an orange light blinking on your monitor.   If your monitor falls off in less than 4 days contact our Monitor department at [phone].  If your monitor becomes loose or falls off after 4 days call [employer] at [phone] for suggestions on securing your monitor.    Follow-Up: At [facility], you and your health needs are our priority.  As part of our continuing mission to provide you with exceptional heart care, we have created designated Provider Care Teams.    These Care Teams include your primary Cardiologist (physician) and Advanced Practice Providers (APPs -  Physician Assistants and Nurse Practitioners) who all work together to provide you with the care you need, when you need it.  We recommend signing up for the patient portal called "MyChart".  Sign up information is provided on this After Visit Summary.  MyChart is used to connect with patients for Virtual Visits (Telemedicine).  Patients are able to view lab/test results, encounter notes, upcoming appointments, etc.  Non-urgent messages can be sent to your provider as well.   To learn more about what you can do with MyChart, go to  [email or web address].    Your next appointment:   2 month(s)  The format for your next appointment:   Either In Person or Virtual  Provider:   Christopher Schumann, MD    

## 2020-03-30 ENCOUNTER — Telehealth: Payer: Self-pay | Admitting: Cardiology

## 2020-03-30 NOTE — Telephone Encounter (Deleted)
Patient stated that he will call back to schedule an F/U appt. With Dr. Bjorn Pippin

## 2020-03-31 ENCOUNTER — Telehealth: Payer: Self-pay | Admitting: Radiology

## 2020-03-31 NOTE — Telephone Encounter (Signed)
LVM for patient to schedule 2 month F/U appt. With Dr. Bjorn Pippin

## 2020-03-31 NOTE — Telephone Encounter (Signed)
Enrolled patient for a 3 day Zio monitor to be mailed to patients home.  

## 2020-04-06 ENCOUNTER — Telehealth: Payer: Self-pay | Admitting: Cardiology

## 2020-04-06 NOTE — Telephone Encounter (Signed)
   Pt is calling regarding his heart monitor, after wearing it after a day it fell off.. he doesn't know what to do next.   Please call

## 2020-04-07 NOTE — Telephone Encounter (Signed)
Please mail monitor back to [facility].  You will not be charged for the monitor if it fell off with less than 4 days of data on it.  I will re-enroll you for [employer] to ship a replacement monitor to your home.  Please make sure chest is completely dry before applying monitor.  Do not shower for 24 hours after apply monitor and please refrain from activities which would make you perspire profusely.

## 2020-04-09 ENCOUNTER — Ambulatory Visit: Payer: Self-pay

## 2020-04-09 DIAGNOSIS — R002 Palpitations: Secondary | ICD-10-CM

## 2020-04-28 ENCOUNTER — Telehealth: Payer: Self-pay | Admitting: Cardiology

## 2020-04-28 NOTE — Telephone Encounter (Signed)
Patient called and wanted to know if Dr. Bjorn Pippin had gotten the results from his short term monitor yet. He also wanted to know if Dr. Bjorn Pippin would just wait to go over those results in person in June or if his Nurse would call

## 2020-04-28 NOTE — Telephone Encounter (Signed)
Spoke with the pt and he had worn his monitor for 1 day then he had some problems with it so they sent him another and he wore for 3 days. It appears it was recently loaded for the MD tor review.Marland Kitchen advised pt I will forward his message to Dr. Bjorn Pippin and will call him back with his recommendations.

## 2020-04-29 NOTE — Telephone Encounter (Signed)
Monitor reviewed, no significant heart rhythm abnormalities

## 2020-05-02 NOTE — Telephone Encounter (Signed)
Spoke to patient, aware of results and verbalized understanding.   He states he believes his palpitations may be related to anxiety.   He states he has been running and this seems to help.    He is wondering if he still needs his appt in June.  Advised we would keep for now, closer to time if he is feeling well and not recurrent symptoms or new symptoms he could cancel/reschedule.    Patient aware and verbalized understanding.

## 2020-05-29 NOTE — Progress Notes (Deleted)
Cardiology Office Note:    Date:  05/29/2020   ID:  Eric Conway, DOB 1971/01/29, MRN [id number]  PCP:  Shirline Frees, MD  Cardiologist:  Donato Heinz, MD  Electrophysiologist:  None   Referring MD: Shirline Frees, MD   No chief complaint on file.   History of Present Illness:    Eric Conway is a 49 y.o. male with a hx of bipolar disorder, hypertension, hyperlipidemia who presents for follow-up.  He was referred by Dr. Kenton Kingfisher for evaluation of palpitations, initially seen on 03/29/2020.  Reports palpitations have occurred for a few months.  During episodes notices his heart is racing.  Occurs at least a few times per day.  He denies any lightheadedness, syncope, chest pain, or dyspnea.  Reports that he exercises by running or brisk walk for 1.5 hours, 4 days/week.  States that he will do 8 to 11 miles per day.  He smokes marijuana once per week, no cigarette use.  Denies any caffeine or alcohol use intake.  Zio patch x4 days on 04/28/2020 showed no significant arrhythmias.   Past Medical History:  Diagnosis Date  . Anxiety   . Bipolar disorder, currently in remission, most recent episode unspecified ([place])   . Combined hyperlipidemia   . Elevated LFTs   . Hypertension   . Insomnia   . Kidney stone   . Lightheadedness   . 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 Palpitations   . Panic attack   . Pure hypercholesterolemia     Past Surgical History:  Procedure Laterality Date  . THYROID SURGERY      Current Medications: No outpatient medications have been marked as taking for the 05/31/20 encounter (Appointment) with Donato Heinz, MD.     Allergies:   Patient has no known allergies.   Social History   Socioeconomic History  . Marital status: Married    Spouse name: Not on file  . Number of children: 2  . Years of education: 60  . Highest education level: Not on file  Occupational History  . Occupation: Careers adviser     Tobacco Use  . Smoking status: Never Smoker  . Smokeless tobacco: Never Used  Vaping Use  . Vaping Use: Never used  Substance and Sexual Activity  . Alcohol use: No    Alcohol/week: 0.0 standard drinks  . Drug use: No  . Sexual activity: Not on file  Other Topics Concern  . Not on file  Social History Narrative   Denies caffeine use    Social Determinants of Health   Financial Resource Strain:   . Difficulty of Paying Living Expenses:   Food Insecurity:   . Worried About Charity fundraiser in the Last Year:   . Arboriculturist in the Last Year:   Transportation Needs:   . Film/video editor (Medical):   Marland Kitchen Lack of Transportation (Non-Medical):   Physical Activity:   . Days of Exercise per Week:   . Minutes of Exercise per Session:   Stress:   . Feeling of Stress :   Social Connections:   . Frequency of Communication with Friends and Family:   . Frequency of Social Gatherings with Friends and Family:   . Attends Religious Services:   . Active Member of Clubs or Organizations:   . Attends Archivist Meetings:   Marland Kitchen Marital Status:      Family History: The patient's family history includes Hyperlipidemia in his father  and mother; Hypertension in his father.  ROS:   Please see the history of present illness.     All other systems reviewed and are negative.  EKGs/Labs/Other Studies Reviewed:    The following studies were reviewed today:   EKG:  EKG is  ordered today.  The ekg ordered today demonstrates normal sinus rhythm, rate 94, no ST/T wave  Recent Labs: No results found for requested labs within last 8760 hours.  Recent Lipid Panel No results found for: CHOL, TRIG, HDL, CHOLHDL, VLDL, LDLCALC, LDLDIRECT  Physical Exam:    VS:  There were no vitals taken for this visit.    Wt Readings from Last 3 Encounters:  03/29/20 235 lb (106.6 kg)  12/23/18 215 lb (97.5 kg)  06/12/17 235 lb 6.4 oz (106.8 kg)     GEN:  Well nourished, well developed  in no acute distress HEENT: Normal NECK: No JVD; No carotid bruits LYMPHATICS: No lymphadenopathy CARDIAC: RRR, no murmurs, rubs, gallops RESPIRATORY:  Clear to auscultation without rales, wheezing or rhonchi  ABDOMEN: Soft, non-tender, non-distended MUSCULOSKELETAL:  No edema; No deformity  SKIN: Warm and dry NEUROLOGIC:  Alert and oriented x 3 PSYCHIATRIC:  Normal affect   ASSESSMENT:    No diagnosis found. PLAN:     Palpitations: Zio patch x4 days on 04/28/2020 showed no significant arrhythmias.  RTC in ***  Medication Adjustments/Labs and Tests Ordered: Current medicines are reviewed at length with the patient today.  Concerns regarding medicines are outlined above.  No orders of the defined types were placed in this encounter.  No orders of the defined types were placed in this encounter.   There are no Patient Instructions on file for this visit.   Signed, Little Ishikawa, MD  05/29/2020 4:05 PM    [facility]

## 2020-05-31 ENCOUNTER — Ambulatory Visit: Payer: Self-pay | Admitting: Cardiology

## 2020-11-08 ENCOUNTER — Other Ambulatory Visit: Payer: Self-pay

## 2020-11-08 ENCOUNTER — Encounter: Payer: Self-pay | Admitting: *Deleted

## 2020-11-08 ENCOUNTER — Ambulatory Visit (INDEPENDENT_AMBULATORY_CARE_PROVIDER_SITE_OTHER): Payer: Self-pay | Admitting: Cardiology

## 2020-11-08 ENCOUNTER — Encounter: Payer: Self-pay | Admitting: Cardiology

## 2020-11-08 VITALS — BP 140/83 | HR 88 | Ht 76.0 in | Wt 236.0 lb

## 2020-11-08 DIAGNOSIS — R002 Palpitations: Secondary | ICD-10-CM

## 2020-11-08 NOTE — Progress Notes (Signed)
Patient ID: Eric Conway, male   DOB: 09-Jan-1971, 49 y.o.   MRN: [id number]  Patient enrolled for [employer] to ship a 14 day ZIO XT long term holter monitor to his home.

## 2020-11-08 NOTE — Patient Instructions (Signed)
Medication Instructions:  Your physician recommends that you continue on your current medications as directed. Please refer to the Current Medication list given to you today.  *If you need a refill on your cardiac medications before your next appointment, please call your pharmacy*  Testing/Procedures:  ZIO XT- Long Term Monitor Instructions   Your physician has requested you wear your ZIO patch monitor___14___days.   This is a single patch monitor.  [employer] supplies one patch monitor per enrollment.  Additional stickers are not available.   Please do not apply patch if you will be having a Nuclear Stress Test, Echocardiogram, Cardiac CT, MRI, or Chest Xray during the time frame you would be wearing the monitor. The patch cannot be worn during these tests.  You cannot remove and re-apply the ZIO XT patch monitor.   Your ZIO patch monitor will be sent USPS Priority mail from [employer] directly to your home address. The monitor may also be mailed to a PO BOX if home delivery is not available.   It may take 3-5 days to receive your monitor after you have been enrolled.   Once you have received you monitor, please review enclosed instructions.  Your monitor has already been registered assigning a specific monitor serial # to you.   Applying the monitor   Shave hair from upper left chest.   Hold abrader disc by orange tab.  Rub abrader in 40 strokes over left upper chest as indicated in your monitor instructions.   Clean area with 4 enclosed alcohol pads .  Use all pads to assure are is cleaned thoroughly.  Let dry.   Apply patch as indicated in monitor instructions.  Patch will be place under collarbone on left side of chest with arrow pointing upward.   Rub patch adhesive wings for 2 minutes.Remove white label marked "1".  Remove white label marked "2".  Rub patch adhesive wings for 2 additional minutes.   While looking in a mirror, press and release button in center of patch.   A small green light will flash 3-4 times .  This will be your only indicator the monitor has been turned on.     Do not shower for the first 24 hours.  You may shower after the first 24 hours.   Press button if you feel a symptom. You will hear a small click.  Record Date, Time and Symptom in the Patient Log Book.   When you are ready to remove patch, follow instructions on last 2 pages of Patient Log Book.  Stick patch monitor onto last page of Patient Log Book.   Place Patient Log Book in Blue box.  Use locking tab on box and tape box closed securely.  The Orange and White box has prepaid postage on it.  Please place in mailbox as soon as possible.  Your physician should have your test results approximately 7 days after the monitor has been mailed back to [employer].   Call [employer] Customer Care at [phone] if you have questions regarding your ZIO XT patch monitor.  Call them immediately if you see an orange light blinking on your monitor.   If your monitor falls off in less than 4 days contact our Monitor department at [phone].  If your monitor becomes loose or falls off after 4 days call [employer] at [phone] for suggestions on securing your monitor.   Follow-Up: At [facility], you and your health needs are our priority.  As part of our continuing   mission to provide you with exceptional heart care, we have created designated Provider Care Teams.  These Care Teams include your primary Cardiologist (physician) and Advanced Practice Providers (APPs -  Physician Assistants and Nurse Practitioners) who all work together to provide you with the care you need, when you need it.  We recommend signing up for the patient portal called "MyChart".  Sign up information is provided on this After Visit Summary.  MyChart is used to connect with patients for Virtual Visits (Telemedicine).  Patients are able to view lab/test results, encounter notes, upcoming appointments, etc.   Non-urgent messages can be sent to your provider as well.   To learn more about what you can do with MyChart, go to [email or web address].    Your next appointment:   3 month(s)  The format for your next appointment:   In Person  Provider:   Christopher Schumann, MD     

## 2020-11-08 NOTE — Progress Notes (Signed)
Cardiology Office Note:    Date:  11/08/2020   ID:  Eric Conway, DOB 1971/02/23, MRN [id number]  PCP:  Johny Blamer, MD  Cardiologist:  Little Ishikawa, MD  Electrophysiologist:  None   Referring MD: Johny Blamer, MD   No chief complaint on file.   History of Present Illness:    Eric Conway is a 49 y.o. male with a hx of bipolar disorder, hypertension, hyperlipidemia who presents for follow-up.  He was referred by Dr. Tiburcio Pea for evaluation of palpitations, initially seen on 03/29/2020.  Reported palpitations have occurred for a few months.  During episodes notices his heart is racing.  Occurs at least a few times per day.  He denies any lightheadedness, syncope, chest pain, or dyspnea.  Reports that he exercises by running or brisk walk for 1.5 hours, 4 days/week.  States that he will do 8 to 11 miles per day.  He smokes marijuana once per week, no cigarette use.  Denies any caffeine or alcohol use intake.  Zio patch x4 days on 04/28/2020 showed no significant arrhythmias.  He reports no symptoms while wearing his monitor.  Since last clinic visit, he reports that he continues to have palpitations.  Occurs couple times per week now.  During episodes feels like heart is racing.  He denies any lightheadedness, syncope, chest pain, or dyspnea.  He was given as needed propranolol to take.  States that he took once and seemed to help with his symptoms.      Past Medical History:  Diagnosis Date  . Anxiety   . Bipolar disorder, currently in remission, most recent episode unspecified ([facility])   . Combined hyperlipidemia   . Elevated LFTs   . Hypertension   . Insomnia   . Kidney stone   . Lightheadedness   . Obsessive compulsive personality disorder ([facility])   . Palpitations   . Panic attack   . Pure hypercholesterolemia     Past Surgical History:  Procedure Laterality Date  . THYROID SURGERY      Current Medications: Current Meds  Medication Sig  . clonazePAM  (KLONOPIN) 1 MG tablet Take 1 mg by mouth 2 (two) times daily as needed for anxiety.  . fluticasone (FLONASE) 50 MCG/ACT nasal spray Place 2 sprays into the nose daily as needed. For congestion  . lamoTRIgine (LAMICTAL) 200 MG tablet Take 200 mg by mouth daily. Reported on 05/18/2016  . lithium carbonate 300 MG capsule Take 300 mg by mouth 2 (two) times daily with a meal.  . ondansetron (ZOFRAN ODT) 4 MG disintegrating tablet Take 1 tablet (4 mg total) by mouth every 8 (eight) hours as needed for nausea or vomiting.  . propranolol (INDERAL) 10 MG tablet Take 10 mg by mouth 2 (two) times daily as needed.  Marland Kitchen QUEtiapine (SEROQUEL) 300 MG tablet Take 300 mg by mouth at bedtime.   . sertraline (ZOLOFT) 25 MG tablet Take 25 mg by mouth daily.  . sildenafil (VIAGRA) 100 MG tablet Take 1 tablet by mouth daily as needed.  . zolpidem (AMBIEN) 10 MG tablet Take 10 mg by mouth at bedtime as needed for sleep.     Allergies:   Patient has no known allergies.   Social History   Socioeconomic History  . Marital status: Married    Spouse name: Not on file  . Number of children: 2  . Years of education: 31  . Highest education level: Not on file  Occupational History  . Occupation: Actuary  Producer   Tobacco Use  . Smoking status: Never Smoker  . Smokeless tobacco: Never Used  Vaping Use  . Vaping Use: Never used  Substance and Sexual Activity  . Alcohol use: No    Alcohol/week: 0.0 standard drinks  . Drug use: No  . Sexual activity: Not on file  Other Topics Concern  . Not on file  Social History Narrative   Denies caffeine use    Social Determinants of Health   Financial Resource Strain:   . Difficulty of Paying Living Expenses: Not on file  Food Insecurity:   . Worried About Programme researcher, broadcasting/film/video in the Last Year: Not on file  . Ran Out of Food in the Last Year: Not on file  Transportation Needs:   . Lack of Transportation (Medical): Not on file  . Lack of Transportation (Non-Medical):  Not on file  Physical Activity:   . Days of Exercise per Week: Not on file  . Minutes of Exercise per Session: Not on file  Stress:   . Feeling of Stress : Not on file  Social Connections:   . Frequency of Communication with Friends and Family: Not on file  . Frequency of Social Gatherings with Friends and Family: Not on file  . Attends Religious Services: Not on file  . Active Member of Clubs or Organizations: Not on file  . Attends Banker Meetings: Not on file  . Marital Status: Not on file     Family History: The patient's family history includes Hyperlipidemia in his father and mother; Hypertension in his father.  ROS:   Please see the history of present illness.     All other systems reviewed and are negative.  EKGs/Labs/Other Studies Reviewed:    The following studies were reviewed today:   EKG:  EKG is  ordered today.  The ekg ordered today demonstrates normal sinus rhythm, rate 88, no ST/T wave  Recent Labs: No results found for requested labs within last 8760 hours.  Recent Lipid Panel No results found for: CHOL, TRIG, HDL, CHOLHDL, VLDL, LDLCALC, LDLDIRECT  Physical Exam:    VS:  BP 140/83 (BP Location: Left Arm, Patient Position: Sitting)   Pulse 88   Ht 6\' 4"  (1.93 m)   Wt 236 lb (107 kg)   SpO2 99%   BMI 28.73 kg/m     Wt Readings from Last 3 Encounters:  11/08/20 236 lb (107 kg)  03/29/20 235 lb (106.6 kg)  12/23/18 215 lb (97.5 kg)     GEN:  Well nourished, well developed in no acute distress HEENT: Normal NECK: No JVD; No carotid bruits LYMPHATICS: No lymphadenopathy CARDIAC: RRR, no murmurs, rubs, gallops RESPIRATORY:  Clear to auscultation without rales, wheezing or rhonchi  ABDOMEN: Soft, non-tender, non-distended MUSCULOSKELETAL:  No edema; No deformity  SKIN: Warm and dry NEUROLOGIC:  Alert and oriented x 3 PSYCHIATRIC:  Normal affect   ASSESSMENT:    1. Palpitations    PLAN:    Palpitations: Zio patch x4 days on  04/28/2020 showed no significant arrhythmias.  He did not have any palpitations while wearing the monitor.  He continues to have palpitations.  Will check Zio patch x14 days to try to capture episode  RTC in 3 months  Medication Adjustments/Labs and Tests Ordered: Current medicines are reviewed at length with the patient today.  Concerns regarding medicines are outlined above.  Orders Placed This Encounter  Procedures  . LONG TERM MONITOR (3-14 DAYS)  . EKG  12-Lead   No orders of the defined types were placed in this encounter.   Patient Instructions  Medication Instructions:  Your physician recommends that you continue on your current medications as directed. Please refer to the Current Medication list given to you today.  *If you need a refill on your cardiac medications before your next appointment, please call your pharmacy*  Testing/Procedures:  ZIO XT- Long Term Monitor Instructions   Your physician has requested you wear your ZIO patch monitor 14 days.   This is a single patch monitor.  [employer] supplies one patch monitor per enrollment.  Additional stickers are not available.   Please do not apply patch if you will be having a Nuclear Stress Test, Echocardiogram, Cardiac CT, MRI, or Chest Xray during the time frame you would be wearing the monitor. The patch cannot be worn during these tests.  You cannot remove and re-apply the ZIO XT patch monitor.   Your ZIO patch monitor will be sent USPS Priority mail from [facility] directly to your home address. The monitor may also be mailed to a PO BOX if home delivery is not available.   It may take 3-5 days to receive your monitor after you have been enrolled.   Once you have received you monitor, please review enclosed instructions.  Your monitor has already been registered assigning a specific monitor serial # to you.   Applying the monitor   Shave hair from upper left chest.   Hold abrader disc by orange tab.  Rub  abrader in 40 strokes over left upper chest as indicated in your monitor instructions.   Clean area with 4 enclosed alcohol pads .  Use all pads to assure are is cleaned thoroughly.  Let dry.   Apply patch as indicated in monitor instructions.  Patch will be place under collarbone on left side of chest with arrow pointing upward.   Rub patch adhesive wings for 2 minutes.Remove white label marked "1".  Remove white label marked "2".  Rub patch adhesive wings for 2 additional minutes.   While looking in a mirror, press and release button in center of patch.  A small green light will flash 3-4 times .  This will be your only indicator the monitor has been turned on.     Do not shower for the first 24 hours.  You may shower after the first 24 hours.   Press button if you feel a symptom. You will hear a small click.  Record Date, Time and Symptom in the Patient Log Book.   When you are ready to remove patch, follow instructions on last 2 pages of Patient Log Book.  Stick patch monitor onto last page of Patient Log Book.   Place Patient Log Book in [place] box.  Use locking tab on box and tape box closed securely.  The [employer] has [employer] on it.  Please place in mailbox as soon as possible.  Your physician should have your test results approximately 7 days after the monitor has been mailed back to [facility].   Call [facility] Customer Care at [phone] if you have questions regarding your ZIO XT patch monitor.  Call them immediately if you see an orange light blinking on your monitor.   If your monitor falls off in less than 4 days contact our Monitor department at [phone].  If your monitor becomes loose or falls off after 4 days call [employer] at [phone] for suggestions on securing your  monitor.     Follow-Up: At [facility], you and your health needs are our priority.  As part of our continuing mission to provide you with exceptional heart care, we  have created designated Provider Care Teams.  These Care Teams include your primary Cardiologist (physician) and Advanced Practice Providers (APPs -  Physician Assistants and Nurse Practitioners) who all work together to provide you with the care you need, when you need it.  We recommend signing up for the patient portal called "MyChart".  Sign up information is provided on this After Visit Summary.  MyChart is used to connect with patients for Virtual Visits (Telemedicine).  Patients are able to view lab/test results, encounter notes, upcoming appointments, etc.  Non-urgent messages can be sent to your provider as well.   To learn more about what you can do with MyChart, go to [email or web address].    Your next appointment:   3 month(s)  The format for your next appointment:   In Person  Provider:   Epifanio Lescheshristopher Matilda Fleig, MD      Signed, Little Ishikawahristopher L Janavia Rottman, MD  11/08/2020 4:47 PM    [facility]

## 2020-12-03 ENCOUNTER — Ambulatory Visit (INDEPENDENT_AMBULATORY_CARE_PROVIDER_SITE_OTHER): Payer: Self-pay

## 2020-12-03 DIAGNOSIS — R002 Palpitations: Secondary | ICD-10-CM

## 2021-02-13 NOTE — Progress Notes (Signed)
Cardiology Office Note:    Date:  02/14/2021   ID:  Eric Conway, DOB 05/27/1971, MRN [id number]  PCP:  Johny Blamer, MD  Cardiologist:  Little Ishikawa, MD  Electrophysiologist:  None   Referring MD: Johny Blamer, MD   Chief Complaint  Patient presents with  . Palpitations    History of Present Illness:    Eric Conway is a 50 y.o. male with a hx of bipolar disorder, hypertension, hyperlipidemia who presents for follow-up.  He was referred by Dr. Tiburcio Pea for evaluation of palpitations, initially seen on 03/29/2020.  Reported palpitations have occurred for a few months.  During episodes notices his heart is racing.  Occurs at least a few times per day.  He denies any lightheadedness, syncope, chest pain, or dyspnea.  Reports that he exercises by running or brisk walk for 1.5 hours, 4 days/week.  States that he will do 8 to 11 miles per day.  He smokes marijuana once per week, no cigarette use.  Denies any caffeine or alcohol use intake.  Zio patch x4 days on 04/28/2020 showed no significant arrhythmias.  He reported no symptoms while wearing his monitor.  Zio patch x14 days on 12/28/2020 showed 1 episode of SVT lasting 14 beats, no significant arrhythmias.  Since last clinic visit, he reports that his palpitations have improved.  Feels like they are related to stress.  He denies any chest pain, dyspnea, lightheadedness, syncope, lower extremity edema.   Past Medical History:  Diagnosis Date  . Anxiety   . Bipolar disorder, currently in remission, most recent episode unspecified (HCC)   . Combined hyperlipidemia   . Elevated LFTs   . Hypertension   . Insomnia   . Kidney stone   . Lightheadedness   . Obsessive compulsive personality disorder (HCC)   . Palpitations   . Panic attack   . Pure hypercholesterolemia     Past Surgical History:  Procedure Laterality Date  . THYROID SURGERY      Current Medications: Current Meds  Medication Sig  . clonazePAM  (KLONOPIN) 1 MG tablet Take 1 mg by mouth 2 (two) times daily as needed for anxiety.  . fluticasone (FLONASE) 50 MCG/ACT nasal spray Place 2 sprays into the nose daily as needed. For congestion  . lamoTRIgine (LAMICTAL) 200 MG tablet Take 200 mg by mouth daily. Reported on 05/18/2016  . lithium carbonate 300 MG capsule Take 300 mg by mouth 2 (two) times daily with a meal.  . ondansetron (ZOFRAN ODT) 4 MG disintegrating tablet Take 1 tablet (4 mg total) by mouth every 8 (eight) hours as needed for nausea or vomiting.  . propranolol (INDERAL) 10 MG tablet Take 10 mg by mouth 2 (two) times daily as needed.  Marland Kitchen QUEtiapine (SEROQUEL) 300 MG tablet Take 300 mg by mouth at bedtime.   . sertraline (ZOLOFT) 25 MG tablet Take 25 mg by mouth daily.  . sildenafil (VIAGRA) 100 MG tablet Take 1 tablet by mouth daily as needed.  . zolpidem (AMBIEN) 10 MG tablet Take 10 mg by mouth at bedtime as needed for sleep.     Allergies:   Patient has no known allergies.   Social History   Socioeconomic History  . Marital status: Married    Spouse name: Not on file  . Number of children: 2  . Years of education: 23  . Highest education level: Not on file  Occupational History  . Occupation: Training and development officer   Tobacco Use  . Smoking  status: Never Smoker  . Smokeless tobacco: Never Used  Vaping Use  . Vaping Use: Never used  Substance and Sexual Activity  . Alcohol use: No    Alcohol/week: 0.0 standard drinks  . Drug use: No  . Sexual activity: Not on file  Other Topics Concern  . Not on file  Social History Narrative   Denies caffeine use    Social Determinants of Health   Financial Resource Strain: Not on file  Food Insecurity: Not on file  Transportation Needs: Not on file  Physical Activity: Not on file  Stress: Not on file  Social Connections: Not on file     Family History: The patient's family history includes Hyperlipidemia in his father and mother; Hypertension in his father.  ROS:    Please see the history of present illness.     All other systems reviewed and are negative.  EKGs/Labs/Other Studies Reviewed:    The following studies were reviewed today:   EKG:  EKG is not ordered today.  The ekg ordered at prior clinic visit demonstrates normal sinus rhythm, rate 88, no ST/T wave  Recent Labs: No results found for requested labs within last 8760 hours.  Recent Lipid Panel No results found for: CHOL, TRIG, HDL, CHOLHDL, VLDL, LDLCALC, LDLDIRECT  Physical Exam:    VS:  BP 123/75   Pulse 88   Ht 6\' 4"  (1.93 m)   Wt 246 lb 3.2 oz (111.7 kg)   SpO2 97%   BMI 29.97 kg/m     Wt Readings from Last 3 Encounters:  02/14/21 246 lb 3.2 oz (111.7 kg)  11/08/20 236 lb (107 kg)  03/29/20 235 lb (106.6 kg)     GEN:  Well nourished, well developed in no acute distress HEENT: Normal NECK: No JVD; No carotid bruits CARDIAC: RRR, no murmurs, rubs, gallops RESPIRATORY:  Clear to auscultation without rales, wheezing or rhonchi  ABDOMEN: Soft, non-tender, non-distended MUSCULOSKELETAL:  No edema; No deformity  SKIN: Warm and dry NEUROLOGIC:  Alert and oriented x 3 PSYCHIATRIC:  Normal affect   ASSESSMENT:    1. Palpitations    PLAN:    Palpitations: Zio patch x4 days on 04/28/2020 showed no significant arrhythmias.  He did not have any palpitations while wearing the monitor.  He continued to palpitations so repeat monitor was done.  Zio patch x14 days on 12/28/2020 showed 1 episode of SVT lasting 14 beats, no significant arrhythmias. -Palpitations have improved, likely related to stress.  Can take propranolol as needed, which he does report helps.  No further cardiac work-up recommended at this time.  RTC as needed  Medication Adjustments/Labs and Tests Ordered: Current medicines are reviewed at length with the patient today.  Concerns regarding medicines are outlined above.  No orders of the defined types were placed in this encounter.  No orders of the defined  types were placed in this encounter.   Patient Instructions  Medication Instructions:  Your physician recommends that you continue on your current medications as directed. Please refer to the Current Medication list given to you today.  *If you need a refill on your cardiac medications before your next appointment, please call your pharmacy*   Follow-Up: At [facility], you and your health needs are our priority.  As part of our continuing mission to provide you with exceptional heart care, we have created designated Provider Care Teams.  These Care Teams include your primary Cardiologist (physician) and Advanced Practice Providers (APPs -  Physician Assistants  and Nurse Practitioners) who all work together to provide you with the care you need, when you need it.  We recommend signing up for the patient portal called "MyChart".  Sign up information is provided on this After Visit Summary.  MyChart is used to connect with patients for Virtual Visits (Telemedicine).  Patients are able to view lab/test results, encounter notes, upcoming appointments, etc.  Non-urgent messages can be sent to your provider as well.   To learn more about what you can do with MyChart, go to [email or web address].    Your next appointment:   AS NEEDED with Dr. Bjorn Pippin      Signed, Little Ishikawa, MD  02/14/2021 1:04 PM    [facility]

## 2021-02-14 ENCOUNTER — Other Ambulatory Visit: Payer: Self-pay

## 2021-02-14 ENCOUNTER — Ambulatory Visit (INDEPENDENT_AMBULATORY_CARE_PROVIDER_SITE_OTHER): Payer: Self-pay | Admitting: Cardiology

## 2021-02-14 ENCOUNTER — Encounter: Payer: Self-pay | Admitting: Cardiology

## 2021-02-14 VITALS — BP 123/75 | HR 88 | Ht 76.0 in | Wt 246.2 lb

## 2021-02-14 DIAGNOSIS — R002 Palpitations: Secondary | ICD-10-CM

## 2021-02-14 NOTE — Patient Instructions (Signed)
Medication Instructions:  ?Your physician recommends that you continue on your current medications as directed. Please refer to the Current Medication list given to you today. ? ?*If you need a refill on your cardiac medications before your next appointment, please call your pharmacy* ? ?Follow-Up: ?At [facility], you and your health needs are our priority.  As part of our continuing mission to provide you with exceptional heart care, we have created designated Provider Care Teams.  These Care Teams include your primary Cardiologist (physician) and Advanced Practice Providers (APPs -  Physician Assistants and Nurse Practitioners) who all work together to provide you with the care you need, when you need it. ? ?We recommend signing up for the patient portal called "MyChart".  Sign up information is provided on this After Visit Summary.  MyChart is used to connect with patients for Virtual Visits (Telemedicine).  Patients are able to view lab/test results, encounter notes, upcoming appointments, etc.  Non-urgent messages can be sent to your provider as well.   ?To learn more about what you can do with MyChart, go to [email or web address].   ? ?Your next appointment:   ?AS NEEDED with Dr. Schumann ? ?
